# Patient Record
Sex: Female | Born: 1945
Health system: Southern US, Community
[De-identification: ages and names within clinical notes are randomized; demographics above are authoritative.]

## PROBLEM LIST (undated history)

## (undated) DIAGNOSIS — H3530 Unspecified macular degeneration: Secondary | ICD-10-CM

## (undated) DIAGNOSIS — Z9889 Other specified postprocedural states: Secondary | ICD-10-CM

## (undated) DIAGNOSIS — Z9289 Personal history of other medical treatment: Secondary | ICD-10-CM

## (undated) DIAGNOSIS — Z853 Personal history of malignant neoplasm of breast: Secondary | ICD-10-CM

## (undated) HISTORY — PX: OVARY SURGERY: SHX727

## (undated) HISTORY — DX: Unspecified macular degeneration: H35.30

## (undated) HISTORY — DX: Personal history of malignant neoplasm of breast: Z85.3

## (undated) HISTORY — PX: HIP SURGERY: SHX245

## (undated) HISTORY — DX: Other specified postprocedural states: Z98.890

## (undated) HISTORY — DX: Personal history of other medical treatment: Z92.89

## (undated) HISTORY — PX: MASTECTOMY: SHX3

## (undated) HISTORY — PX: APPENDECTOMY: SHX54

---

## 1998-06-23 ENCOUNTER — Other Ambulatory Visit: Admission: RE | Admit: 1998-06-23 | Discharge: 1998-06-23 | Payer: Self-pay | Admitting: *Deleted

## 2000-03-23 ENCOUNTER — Other Ambulatory Visit: Admission: RE | Admit: 2000-03-23 | Discharge: 2000-03-23 | Payer: Self-pay | Admitting: *Deleted

## 2000-06-05 ENCOUNTER — Encounter: Admission: RE | Admit: 2000-06-05 | Discharge: 2000-06-05 | Payer: Self-pay | Admitting: *Deleted

## 2001-05-10 ENCOUNTER — Other Ambulatory Visit: Admission: RE | Admit: 2001-05-10 | Discharge: 2001-05-10 | Payer: Self-pay | Admitting: *Deleted

## 2001-06-18 ENCOUNTER — Ambulatory Visit (HOSPITAL_COMMUNITY): Admission: RE | Admit: 2001-06-18 | Discharge: 2001-06-18 | Payer: Self-pay | Admitting: *Deleted

## 2001-07-13 ENCOUNTER — Ambulatory Visit (HOSPITAL_COMMUNITY): Admission: RE | Admit: 2001-07-13 | Discharge: 2001-07-13 | Payer: Self-pay | Admitting: *Deleted

## 2002-05-02 ENCOUNTER — Encounter: Admission: RE | Admit: 2002-05-02 | Discharge: 2002-05-07 | Payer: Self-pay | Admitting: *Deleted

## 2003-08-08 ENCOUNTER — Other Ambulatory Visit: Admission: RE | Admit: 2003-08-08 | Discharge: 2003-08-08 | Payer: Self-pay | Admitting: *Deleted

## 2010-08-02 ENCOUNTER — Other Ambulatory Visit: Payer: Self-pay | Admitting: Orthopedic Surgery

## 2010-08-02 ENCOUNTER — Encounter (HOSPITAL_COMMUNITY): Payer: BC Managed Care – PPO | Attending: Orthopedic Surgery

## 2010-08-02 DIAGNOSIS — Z01812 Encounter for preprocedural laboratory examination: Secondary | ICD-10-CM | POA: Insufficient documentation

## 2010-08-02 DIAGNOSIS — Z79899 Other long term (current) drug therapy: Secondary | ICD-10-CM | POA: Insufficient documentation

## 2010-08-02 LAB — URINALYSIS, ROUTINE W REFLEX MICROSCOPIC
Bilirubin Urine: NEGATIVE
Glucose, UA: NEGATIVE mg/dL
Hgb urine dipstick: NEGATIVE
Ketones, ur: NEGATIVE mg/dL
Nitrite: NEGATIVE
Protein, ur: NEGATIVE mg/dL
Specific Gravity, Urine: 1.01 (ref 1.005–1.030)
Urobilinogen, UA: 0.2 mg/dL (ref 0.0–1.0)
pH: 6.5 (ref 5.0–8.0)

## 2010-08-02 LAB — CBC
HCT: 41.2 % (ref 36.0–46.0)
Hemoglobin: 13.2 g/dL (ref 12.0–15.0)
MCH: 29.3 pg (ref 26.0–34.0)
MCHC: 32 g/dL (ref 30.0–36.0)
MCV: 91.6 fL (ref 78.0–100.0)
Platelets: 299 10*3/uL (ref 150–400)
RBC: 4.5 MIL/uL (ref 3.87–5.11)
RDW: 13 % (ref 11.5–15.5)
WBC: 7 10*3/uL (ref 4.0–10.5)

## 2010-08-02 LAB — COMPREHENSIVE METABOLIC PANEL
ALT: 15 U/L (ref 0–35)
AST: 19 U/L (ref 0–37)
Albumin: 4.2 g/dL (ref 3.5–5.2)
Alkaline Phosphatase: 60 U/L (ref 39–117)
BUN: 11 mg/dL (ref 6–23)
CO2: 29 mEq/L (ref 19–32)
Calcium: 9.8 mg/dL (ref 8.4–10.5)
Chloride: 104 mEq/L (ref 96–112)
Creatinine, Ser: 0.68 mg/dL (ref 0.4–1.2)
GFR calc Af Amer: >60 mL/min (ref >60)
GFR calc non Af Amer: >60 mL/min (ref >60)
Glucose, Bld: 92 mg/dL (ref 70–99)
Potassium: 4.4 mEq/L (ref 3.5–5.1)
Sodium: 140 mEq/L (ref 135–145)
Total Bilirubin: 0.7 mg/dL (ref 0.3–1.2)
Total Protein: 7.5 g/dL (ref 6.0–8.3)

## 2010-08-02 LAB — DIFFERENTIAL
Basophils Absolute: 0.1 10*3/uL (ref 0.0–0.1)
Basophils Relative: 1 % (ref 0–1)
Eosinophils Absolute: 0.2 10*3/uL (ref 0.0–0.7)
Eosinophils Relative: 3 % (ref 0–5)
Lymphocytes Relative: 30 % (ref 12–46)
Lymphs Abs: 2.1 10*3/uL (ref 0.7–4.0)
Monocytes Absolute: 0.4 10*3/uL (ref 0.1–1.0)
Monocytes Relative: 6 % (ref 3–12)
Neutro Abs: 4.2 10*3/uL (ref 1.7–7.7)
Neutrophils Relative %: 60 % (ref 43–77)

## 2010-08-02 LAB — SURGICAL PCR SCREEN
MRSA, PCR: NEGATIVE
Staphylococcus aureus: NEGATIVE

## 2010-08-02 LAB — APTT: aPTT: 37 seconds (ref 24–37)

## 2010-08-02 LAB — PROTIME-INR
INR: 1.02 (ref 0.00–1.49)
Prothrombin Time: 13.6 seconds (ref 11.6–15.2)

## 2010-08-05 NOTE — H&P (Signed)
  NAME:  [name]               ACCOUNT NO.:  [id number]  MEDICAL RECORD NO.:  [id number]          PATIENT TYPE:  LOCATION:                                 FACILITY:  PHYSICIAN:  Madlyn Frankel. Charlann Boxer, M.D.  DATE OF BIRTH:  [birth year]  DATE OF ADMISSION: DATE OF DISCHARGE:                             HISTORY & PHYSICAL   PHYSICIAN ASSISTANT:  Jodene Nam.  DATE OF SURGERY:  August 10, 2010.  CHIEF COMPLAINT:  Right hip osteoarthritis.  HISTORY OF PRESENT ILLNESS:  This is a 65 year old female for evaluation of her bilateral hips with osteoarthritis of bilateral hips, right greater than left.  She has had conservative treatment without relief of her symptoms.  After discussion of treatments, benefits, risks and options, the patient is now scheduled for total hip arthroplasty on the right.  She has had breast cancer in the past, and therefore will not get tranexamic acid preoperatively.  PAST MEDICAL HISTORY:  Drug allergies to DURICEF with a rash.  CURRENT MEDICATIONS:  Boniva.  PAST SURGERIES:  Tonsils and appendix in childhood, left mastectomy for breast cancer with breast reimplantation and ovarian cyst removal in 2000.  SERIOUS MEDICAL ILLNESSES:  Osteoporosis.  FAMILY HISTORY:  Father deceased with MI.  Mother healthy at 33.  One sibling healthy at 66.  Two healthy children.  SOCIAL HISTORY:  The patient is married.  She is a Ph.D. Veterinary surgeon.  She does not smoke and has about one drink a day.  REVIEW OF SYSTEMS:  CENTRAL NERVOUS SYSTEM:  Negative for headache, blurred vision, or dizziness. PULMONARY:  Negative for shortness of breath, PND or orthopnea.  CARDIOVASCULAR:  Negative for chest pain or palpitation.  GI:  Negative for ulcers, hepatitis.  GU:  Negative for urinary tract difficulty.  MUSCULOSKELETAL:  Positive as in HPI. HEMATOLOGIC/ONCOLOGIC:  History of breast cancer.  PHYSICAL EXAM:  VITAL SIGNS:  BP 150/84, respirations 16, pulse 64  and regular. GENERAL APPEARANCE:  This is a well-developed, well-nourished lady in no acute distress. HEENT:  Head normocephalic.  Nose patent.  Ears patent.  Pupils equal, round, and reactive to light.  Throat without injection.  Neck:  Supple without adenopathy.  Carotids are 2+ without bruit. CHEST:  Clear to auscultation.  No rales or rhonchi.  Respirations 16. HEART:  Regular rate and rhythm at 64 beats per minute without murmur. ABDOMEN:  Soft with active bowel sounds.  No masses or organomegaly. NEUROLOGIC:  The patient is alert and oriented to time, place and person. Cranial nerves II through XII grossly intact. EXTREMITIES:  Both hips with decreased range of motion, significant pain during the range of motion of both.  ASSESSMENT:  Right hip osteoarthritis.  PLAN OF ACTION:  Total hip arthroplasty, right hip.     Jaquelyn Bitter. Chabon, P.A.   ______________________________ Madlyn Frankel Charlann Boxer, M.D.    SJC/MEDQ  D:  07/28/2010  T:  07/29/2010  Job:  [id number]  Electronically Signed by Jodene Nam P.A. on 08/01/2010 08:10:02 AM Electronically Signed by Durene Romans M.D. on 08/05/2010 12:12:07 PM

## 2010-08-10 ENCOUNTER — Inpatient Hospital Stay (HOSPITAL_COMMUNITY): Payer: BC Managed Care – PPO

## 2010-08-10 ENCOUNTER — Inpatient Hospital Stay (HOSPITAL_COMMUNITY)
Admission: RE | Admit: 2010-08-10 | Discharge: 2010-08-12 | DRG: 818 | Disposition: A | Discharge status: Home Health | Payer: BC Managed Care – PPO | Source: Ambulatory Visit | Attending: Orthopedic Surgery | Admitting: Orthopedic Surgery

## 2010-08-10 DIAGNOSIS — D62 Acute posthemorrhagic anemia: Secondary | ICD-10-CM | POA: Diagnosis not present

## 2010-08-10 DIAGNOSIS — M810 Age-related osteoporosis without current pathological fracture: Secondary | ICD-10-CM | POA: Diagnosis present

## 2010-08-10 DIAGNOSIS — M1610 Unilateral primary osteoarthritis, unspecified hip: Principal | ICD-10-CM | POA: Diagnosis present

## 2010-08-10 DIAGNOSIS — M169 Osteoarthritis of hip, unspecified: Principal | ICD-10-CM | POA: Diagnosis present

## 2010-08-10 DIAGNOSIS — Z853 Personal history of malignant neoplasm of breast: Secondary | ICD-10-CM

## 2010-08-10 LAB — TYPE AND SCREEN: Antibody Screen: NEGATIVE

## 2010-08-10 LAB — GLUCOSE, CAPILLARY: Glucose-Capillary: 176 mg/dL — ABNORMAL HIGH (ref 70–99)

## 2010-08-10 LAB — ABO/RH: ABO/RH(D): A NEG

## 2010-08-11 LAB — BASIC METABOLIC PANEL
BUN: 6 mg/dL (ref 6–23)
Chloride: 104 mEq/L (ref 96–112)
GFR calc Af Amer: >60 mL/min (ref >60)
GFR calc non Af Amer: >60 mL/min (ref >60)
Potassium: 4.3 mEq/L (ref 3.5–5.1)
Sodium: 133 mEq/L — ABNORMAL LOW (ref 135–145)

## 2010-08-11 LAB — CBC
Platelets: 208 10*3/uL (ref 150–400)
RBC: 2.55 MIL/uL — ABNORMAL LOW (ref 3.87–5.11)
RDW: 12.8 % (ref 11.5–15.5)
WBC: 9.9 10*3/uL (ref 4.0–10.5)

## 2010-08-11 NOTE — Op Note (Signed)
NAMEJOANELL, [name]              ACCOUNT NO.:  [id number]  MEDICAL RECORD NO.:  [id number]           PATIENT TYPE:  I  LOCATION:  1603                         FACILITY:  [facility]  PHYSICIAN:  Madlyn Frankel. Charlann Boxer, M.D.  DATE OF BIRTH:  March 22, 1946  DATE OF PROCEDURE:  08/10/2010 DATE OF DISCHARGE:                              OPERATIVE REPORT   PREOPERATIVE DIAGNOSIS:  Right hip osteoarthritis.  POSTOPERATIVE DIAGNOSIS:  Right hip osteoarthritis.  PROCEDURE:  Right total hip replacement through an anterior approach.  COMPONENTS USED:  DePuy hip system size and 50 Pinnacle shell single cancellous screw hole eliminator and 32+4 AltrX liner and size 6 standard Tri-Lock stem with a 32 +1 Delta ceramic ball.  SURGEON:  Madlyn Frankel. Charlann Boxer, M.D.  ASSISTANT:  Jaquelyn Bitter. Chabon, P.A.  ANESTHESIA:  General.  SPECIMENS:  None.  COMPLICATIONS:  None.  BLOOD LOSS:  About 900 mL.  DRAINS:  One Hemovac.  INDICATIONS FOR PROCEDURE:  Ms. Peitz is a 65 year old female who presented to office for bilateral hip pain.  Radiographs revealed end- stage bilateral hip pain, right was clinically little bit worse than the left.  She at this point was ready to proceed with more definitive measures of hip arthroplasty.  Risks and benefits of infection, DVT, component failure, need for revision surgery all discussed as well as the surgical approaches.  She wished at this point to proceed with the anterior hip replacement.  Specific risks and benefits were discussed regarding this compared to posterior approach.  Consent was obtained for benefit of pain relief.  PROCEDURE IN DETAIL:  The patient was brought to operative theater. Once adequate anesthesia, preoperative antibiotics, clindamycin administered, she was positioned supine on the [employer] table.  Once bony prominences were adequately padded, fluoroscopy was brought into the field to identify landmarks and to confirm positioning.  The right  hip was then prepped from the proximal iliac crest to mid to lower thigh.  Once it was prepped and draped, I marked the incision and then dressed it with a shower curtain drape.  A time-out was performed identifying the patient, planned procedure and the extremity.  At this point, a incision was made 2 cm lateral to the anterior-superior iliac spine extending over the anterior aspect of the trochanter.  Sharp dissection was carried to the fascia.  The tensor fascial lata and soft tissue planes created, protractor was placed.  The fascia was then incised and the muscle belly separated from the undersurface of the fascia and retracted and swept laterally.  A retractor was placed in the superior neck.  At this point, the anterior circumflex vessels were cauterized and pericapsular fat removed and inferior retractor placed. A bent Hohmann retractor was placed over the anterior aspect of the acetabulum and the rectus elevated off the acetabulum.  At this point, L capsulotomy made along the superior neck to the trochanteric fossa and then extending to the lesser trochanter.  Stay sutures were placed and the retractors were then placed intracapsular.  At this point, identified landmarks and marked the proposed neck cut. Under fluoroscopic imaging, I then confirmed this neck cut.  A neck osteotomy was made with traction applied.  The femoral head was removed without difficulty and noted severe degenerative changes of right hip.  Traction was let loose and retractors were placed for preparation of acetabulum.  Labrum was debrided sharply.  The patient had significant medial wall osteophytes.  I began with reaming with a 44 reamer and reamed down to the medial wall and then into the correct orientation of the cup.  Fluoroscopy was used to confirm the position of reaming.  I reamed up to 49 reamer with good bony bed preparation.  A 50 Pinnacle cup was chosen.  This cup was then impacted with a  curved impactor under fluoroscopy and I readily confirmed the location in terms of abduction. Adequate forward flexion was identified anatomically with cup position, posterior and superior lateral exposed as well as beneath the anterior rim anteriorly.  A single cancellus screw was placed and hole limiter placed.  A final 32+4 AltrX liner was then impacted with good visualized rim fit.  At this point, the attention was now directed to the femur.  The femur was rotated to about 80 degrees, initially removing some of the inferior capsule.  A retractor was placed along the medial aspect of the femoral neck.  The capsule was released posteriorly and a posterior retractor placed.  Identified the anatomy of the proximal femur, used a box osteotome to open up the proximal femur and then by hand passed a broach.  The starting broach then evaluated radiographically to confirm that the stem was in the correct position.  Once this was done, retractors were replaced using the hook for elevation of the femur.  I then began broaching with a size 1 broach and broached up to a size 5 initially.  I did do a trial reduction in this form with a 5 broach in place, a standard neck based off for radiographs preoperatively.  A 32 +1 ball was trialed.  During the trial reduction, confirmed that the leg lengths appeared to be near equal.  At this point, given the advanced degenerative change in the left hip, it was not as much of a concern; however, it was very close.  I also felt that I could jump up to a size 6 broach with little bit better fit.  At this point, the hip was dislocated.  The size 6 broach was then impacted followed by the opening up of the 6 standard Tri-Lock stem.  This was then impacted and sat about 1-2 mm prior to the neck cut medially.  I did a repeat trial reduction at this point with a 32 +1 ball and was happy with the range of motion, the stability as well as the leg lengths on  radiographs.  The final 32 +1 ball was chosen and impacted on a clean and dry trunnion and the hip reduced.  The hip had been irrigated throughout the case and again at this point.  I reapproximated the anterior capsule to the superior leaflet using #1 Vicryl.  The remaining of the wound at this point was closed over medium Hemovac drain using #1 Vicryl on the fascia, the tensor fascia lata, 2-0 Vicryl on the subcu layer and running 4-0 Monocryl.  The hip was cleaned, dried, dressed sterilely with Dermabond and Aquacel dressing and drain site was dressed separately.  She was then extubated and brought to the recovery room in stable condition, tolerating the procedure well.     Madlyn Frankel Charlann Boxer, M.D.  MDO/MEDQ  D:  08/10/2010  T:  08/10/2010  Job:  [id number]  Electronically Signed by Durene Romans M.D. on 08/11/2010 81:19:14 PM

## 2010-08-12 LAB — BASIC METABOLIC PANEL
Chloride: 107 mEq/L (ref 96–112)
GFR calc non Af Amer: >60 mL/min (ref >60)
Glucose, Bld: 110 mg/dL — ABNORMAL HIGH (ref 70–99)
Potassium: 4.2 mEq/L (ref 3.5–5.1)
Sodium: 138 mEq/L (ref 135–145)

## 2010-08-12 LAB — CBC
HCT: 22.3 % — ABNORMAL LOW (ref 36.0–46.0)
Hemoglobin: 7.3 g/dL — ABNORMAL LOW (ref 12.0–15.0)
MCV: 91.8 fL (ref 78.0–100.0)
RBC: 2.43 MIL/uL — ABNORMAL LOW (ref 3.87–5.11)
RDW: 13 % (ref 11.5–15.5)
WBC: 7.7 10*3/uL (ref 4.0–10.5)

## 2010-08-16 NOTE — Discharge Summary (Signed)
[name], [name]              ACCOUNT NO.:  [id number]  MEDICAL RECORD NO.:  [id number]           PATIENT TYPE:  I  LOCATION:  1603                         FACILITY:  [facility]  PHYSICIAN:  Madlyn Frankel. Charlann Boxer, M.D.  DATE OF BIRTH:  [birth year]  DATE OF ADMISSION:  08/10/2010 DATE OF DISCHARGE:  08/12/2010                              DISCHARGE SUMMARY   ADMITTING DIAGNOSIS:  Right hip osteoarthritis.  DISCHARGE DIAGNOSES: 1. Right hip osteoarthritis, status post right total hip replacement     on 08/10/2010. 2. Osteoporosis.  BRIEF HISTORY:  [name] is a 65 year old female with right hip osteoarthritis.  She was seen and evaluated in the office and was noted to have failed all conservative measures prior to evaluation.  She has significant reduction in overall quality of life and wished to discuss more advanced measures of treatment.  Risks and benefits of hip replacement were discussed and reviewed.  Consent was obtained for this for same-day surgery.  HOSPITAL COURSE:  The patient admitted for same-day surgery on 08/10/2010.  She underwent a right total hip replacement through an anterior approach.  Please see dictated operative note for full details of the procedure.  Postoperatively, after routine stay in the recovery room, she was transferred to the orthopedic ward where she remained for her 2 postoperative days.  Postoperative day #1, she had her Foley removed.  Hemovac removed after 24 hours for monitoring due to her receiving tranexamic acid.  She was seen and evaluated by Physical Therapy, weightbearing as tolerated in the right lower extremity with no precautions.  Her labs on postoperative day #2 revealed hematocrit of 22.3 down only from 23.4, thus stable.  Her vital signs remained stable most importantly with no significant hypertensive episodes while in the hospital.  Her electrolytes remained stable with no bump in her renal function.  Her wound remained  dry and on postoperative day #2, she is ready to go home.  DISCHARGE INSTRUCTIONS:  The patient to be discharged home with her husband with home health physical therapy.  She will keep her right hip wound dry and clean.  She has an Aquacel dressing and will remain on for 7-8 days, it is waterproof, so she may shower without covering; otherwise, she should keep her wound as dry as possible.  She will return to see Dr. Durene Romans at [facility] at [phone] in 2-week period of time.  Any questions can be addressed to our office.  DISCHARGE MEDICATIONS: 1. Her    home use of Boniva as directed.  Otherwise, she will be on     Colace 100 mg p.o. b.i.d. for constipation while on pain medicine. 2. MiraLax 17 g p.o. daily as needed for constipation while on pain     medicine. 3. Robaxin 500 mg q.6 hours as needed for muscle spasms and pain. 4. Norco 7.5/325 mg 1-2 tablets every 4-6 hours as needed for pain     while doing therapy in Recovery. 5. Xarelto 10 mg p.o. daily for blood thinning for 10 days and then do aspirin for 4 weeks 325 mg p.o. daily. 6. Iron 325  mg p.o. 2-3 times a day as tolerable for up to 3 weeks.     This will be directed through our office as well.  Questions were encouraged and answers reviewed at the time of discharge.     Madlyn Frankel Charlann Boxer, M.D.     MDO/MEDQ  D:  08/15/2010  T:  08/16/2010  Job:  [id number]  Electronically Signed by Durene Romans M.D. on 08/16/2010 10:32:03 AM

## 2010-08-31 ENCOUNTER — Other Ambulatory Visit: Payer: Self-pay | Admitting: Orthopedic Surgery

## 2010-08-31 ENCOUNTER — Encounter (HOSPITAL_COMMUNITY): Payer: BC Managed Care – PPO

## 2010-08-31 LAB — PROTIME-INR
INR: 1.05 (ref 0.00–1.49)
Prothrombin Time: 13.9 s (ref 11.6–15.2)

## 2010-08-31 LAB — DIFFERENTIAL
Basophils Absolute: 0 K/uL (ref 0.0–0.1)
Basophils Relative: 0 % (ref 0–1)
Eosinophils Absolute: 0.2 K/uL (ref 0.0–0.7)
Eosinophils Relative: 2 % (ref 0–5)
Lymphocytes Relative: 19 % (ref 12–46)
Lymphs Abs: 1.4 K/uL (ref 0.7–4.0)
Monocytes Absolute: 0.6 K/uL (ref 0.1–1.0)
Monocytes Relative: 9 % (ref 3–12)
Neutro Abs: 5.1 K/uL (ref 1.7–7.7)
Neutrophils Relative %: 70 % (ref 43–77)

## 2010-08-31 LAB — CBC
HCT: 35.1 % — ABNORMAL LOW (ref 36.0–46.0)
Hemoglobin: 10.9 g/dL — ABNORMAL LOW (ref 12.0–15.0)
MCH: 29.9 pg (ref 26.0–34.0)
MCHC: 31.1 g/dL (ref 30.0–36.0)
MCV: 96.4 fL (ref 78.0–100.0)
Platelets: 385 K/uL (ref 150–400)
RBC: 3.64 MIL/uL — ABNORMAL LOW (ref 3.87–5.11)
RDW: 14.4 % (ref 11.5–15.5)
WBC: 7.4 K/uL (ref 4.0–10.5)

## 2010-08-31 LAB — SURGICAL PCR SCREEN
MRSA, PCR: NEGATIVE
Staphylococcus aureus: NEGATIVE

## 2010-08-31 LAB — BASIC METABOLIC PANEL WITH GFR
BUN: 13 mg/dL (ref 6–23)
CO2: 27 meq/L (ref 19–32)
Calcium: 10 mg/dL (ref 8.4–10.5)
Chloride: 105 meq/L (ref 96–112)
Creatinine, Ser: 0.66 mg/dL (ref 0.4–1.2)
GFR calc non Af Amer: >60 mL/min
Glucose, Bld: 88 mg/dL (ref 70–99)
Potassium: 4.7 meq/L (ref 3.5–5.1)
Sodium: 140 meq/L (ref 135–145)

## 2010-08-31 LAB — URINALYSIS, ROUTINE W REFLEX MICROSCOPIC
Bilirubin Urine: NEGATIVE
Glucose, UA: NEGATIVE mg/dL
Hgb urine dipstick: NEGATIVE
Ketones, ur: NEGATIVE mg/dL
Nitrite: NEGATIVE
Protein, ur: NEGATIVE mg/dL
Specific Gravity, Urine: 1.012 (ref 1.005–1.030)
Urobilinogen, UA: 0.2 mg/dL (ref 0.0–1.0)
pH: 5 (ref 5.0–8.0)

## 2010-08-31 LAB — APTT: aPTT: 33 s (ref 24–37)

## 2010-09-07 ENCOUNTER — Inpatient Hospital Stay (HOSPITAL_COMMUNITY): Payer: BC Managed Care – PPO

## 2010-09-07 ENCOUNTER — Inpatient Hospital Stay (HOSPITAL_COMMUNITY)
Admission: RE | Admit: 2010-09-07 | Discharge: 2010-09-09 | DRG: 818 | Disposition: A | Discharge status: Home Health | Payer: BC Managed Care – PPO | Source: Ambulatory Visit | Attending: Orthopedic Surgery | Admitting: Orthopedic Surgery

## 2010-09-07 DIAGNOSIS — M169 Osteoarthritis of hip, unspecified: Principal | ICD-10-CM | POA: Diagnosis present

## 2010-09-07 DIAGNOSIS — Z01812 Encounter for preprocedural laboratory examination: Secondary | ICD-10-CM

## 2010-09-07 DIAGNOSIS — M1610 Unilateral primary osteoarthritis, unspecified hip: Principal | ICD-10-CM | POA: Diagnosis present

## 2010-09-07 DIAGNOSIS — D649 Anemia, unspecified: Secondary | ICD-10-CM | POA: Diagnosis present

## 2010-09-07 LAB — TYPE AND SCREEN
ABO/RH(D): A NEG
Antibody Screen: NEGATIVE

## 2010-09-08 LAB — CBC
HCT: 23.7 % — ABNORMAL LOW (ref 36.0–46.0)
MCHC: 31.6 g/dL (ref 30.0–36.0)
MCV: 94 fL (ref 78.0–100.0)
RDW: 13.9 % (ref 11.5–15.5)
WBC: 7.7 10*3/uL (ref 4.0–10.5)

## 2010-09-08 LAB — BASIC METABOLIC PANEL
BUN: 5 mg/dL — ABNORMAL LOW (ref 6–23)
GFR calc non Af Amer: >60 mL/min (ref >60)
Glucose, Bld: 106 mg/dL — ABNORMAL HIGH (ref 70–99)
Potassium: 4 mEq/L (ref 3.5–5.1)

## 2010-09-09 LAB — BASIC METABOLIC PANEL
BUN: 2 mg/dL — ABNORMAL LOW (ref 6–23)
Calcium: 8.2 mg/dL — ABNORMAL LOW (ref 8.4–10.5)
Creatinine, Ser: 0.54 mg/dL (ref 0.4–1.2)
GFR calc non Af Amer: >60 mL/min (ref >60)
Glucose, Bld: 102 mg/dL — ABNORMAL HIGH (ref 70–99)
Sodium: 140 mEq/L (ref 135–145)

## 2010-09-09 LAB — CBC
HCT: 24.3 % — ABNORMAL LOW (ref 36.0–46.0)
MCHC: 32.5 g/dL (ref 30.0–36.0)
RDW: 13.8 % (ref 11.5–15.5)

## 2010-09-10 NOTE — Op Note (Signed)
[name], [name]              ACCOUNT NO.:  [id number]  MEDICAL RECORD NO.:  [id number]           PATIENT TYPE:  I  LOCATION:  0008                         FACILITY:  [facility]  PHYSICIAN:  Madlyn Frankel. Charlann Boxer, M.D.  DATE OF BIRTH:  1945/12/13  DATE OF PROCEDURE:  09/07/2010 DATE OF DISCHARGE:                              OPERATIVE REPORT   PREOPERATIVE DIAGNOSIS:  Left hip osteoarthritis.  POSTOPERATIVE DIAGNOSIS:  Left hip osteoarthritis.  PROCEDURE:  Left total-hip replacement through an anterior approach utilizing DePuy component size 50 pinnacle shell, size 32 +4 neutral Altrex liner, size 5 standard Trilock stem with a 32 +1 Delta ceramic ball.  SURGEON:  Madlyn Frankel. Charlann Boxer, M.D.  ASSISTANT:  Jaquelyn Bitter. Chabon, P.A.C.  ANESTHESIA:  General.  SPECIMENS:  None.  COMPLICATIONS:  None.  ESTIMATED BLOOD LOSS:  About 500 cc.  DRAINS:  One Hemovac.  INDICATIONS FOR PROCEDURE:  [name] is a pleasant 65 year old patientthat had presented to the office for evaluation of bilateral hip pain. She is now status post a right total-hip replacement  that she has done very well with.  She is, at this point, ready to have her left hip replaced as previously planned and reviewed in the office.  The risks and benefits were discussed and reviewed as well as the postoperative course and expectations.  She had done very well with the right hip and was ready to have this done.  Consent was obtained.  PROCEDURE IN DETAIL:  The patient was brought to the operative theater. Once adequate anesthesia, preoperative antibiotics, clindamycin administered, she was positioned supine on the [employer] table.  Bony prominences were padded and adequate positioning.  At this point, fluoroscopy was brought into the field identifying landmarks and parameters for radiographic evaluation of the hip during the case.  At this point, the left hip was prepped and draped from the proximal to the iliac crest to  the mid femur or thigh.  I then made markings identifying landmarks and the location for incision and then used the shower curtain for final draping.  Time-out was performed identifying the patient, procedure and extremity.  An incision was made 2 cm distal and lateral to the anterior-superior iliac spine, extending over the anterior aspect of the trochanter.  The tensor fascia muscle fascia was identified and soft-tissue planes were created placing the protractor.  The fascia was then incised, the muscle separated from underneath the underside of the fascia.  Superior neck retractor was placed.  At this point, I cauterized the anterior circumflex vessels and removed pericapsular fat and placed a retractor inferiorly.  At this point, once I elevated the rectus femoris from the anterior aspect of the acetabulum, an L capsulotomy was made.  Stay sutures were placed and the retractor was placed intracapsular.  At this point, traction was applied and then fluoroscopy was brought in to identify the location and the level of my neck cut as compared to the contralateral hip.  Following the neck cut, the femoral head was removed without difficulty or apparent complications at this point.  At this point, based on the contralateral hip and  using a 50 cup, I used a 47 reamer initially reaming just proximal to the foveal region.  I then reamed under fluoroscopic imaging, confirming the depth of reaming as well as orientation.  I reamed up to a 49 reamer with good bony bed preparation and following final debridement and preparation, the final 50 cup was chosen.  The final 50 Pinnacle cup was then impacted with the curved impactor under fluoroscopic imaging.  A single cancellus screw was placed, the hole eliminator was placed and the final 32 +4 Altrex liner was impacted into position.  At this point, the femur was rotated to 80 degrees as I released the inferior capsule off the inferior portion  of the medial neck.  The lateral hook was then applied and the femur elevated under my direct pressure with the hook held in place at this point.  I further rotated the hip to about 100 degrees and released the posterior capsule to allow for placement of the retractor against the lateral posterior trochanter. The leg was now dropped, extended and adducted.  With the proximal and posterior lateral aspect of the femur exposed, the box osteotome was used to open up the proximal femur.  I then used the pituitary rongeur to remove some remaining bone laterally.  The starting reamer was then used and passed by hand into the femur.  I then began broaching and broached up initially to a size 6, which did sit a little bit proud of the current neck cut based on the contralateral hip.  I did a trial reduction at this point, and when I did this, we confirmed radiographically that perhaps a little bit longer on this side.  For this reason, I chose to drop down to a size 5 broach and removed some of the bone off the proximal neck.  I redid my trial reduction and felt at this point that the leg lengths were very close to equal.  The final 5 standard Trilock stem was chosen.  The trial components were removed.  The final size 5 Trilock standard stem was then impacted and sat just a millimeter or so above my neck cut and based on the trial reductions, we chose a 32 +1 ball.  This final ball was then impacted onto a clean and dry trunnion and the hip reduced.  We had irrigated the hip throughout the case and again at this point.  I was able to reapproximate the anterior capsule to itself using #1 Vicryl and I placed a medium Hemovac drain deep.  I had taken out posterior and distal to the incision.  The fascia of the tensor fascia lata was then reapproximated using #1 Vicryl in a running fashion.  The remaining of the wound was closed with 2-0 Vicryl and running 4-0 Monocryl.  The hip was cleaned, dried  and dressed sterilely using Dermabond and Aquacel dressing.  She was then brought to the recovery room in stable condition, tolerating the procedure well.     Madlyn Frankel Charlann Boxer, M.D.     MDO/MEDQ  D:  09/07/2010  T:  09/07/2010  Job:  [id number]  Electronically Signed by Durene Romans M.D. on 09/10/2010 03:40:37 PM

## 2010-10-04 NOTE — Discharge Summary (Signed)
NAMEALESE, FURNISS              ACCOUNT NO.:  [id number]  MEDICAL RECORD NO.:  [id number]           PATIENT TYPE:  I  LOCATION:  1619                         FACILITY:  [facility]  PHYSICIAN:  Madlyn Frankel. Charlann Boxer, M.D.  DATE OF BIRTH:  03-19-1946  DATE OF ADMISSION:  09/07/2010 DATE OF DISCHARGE:  09/09/2010                              DISCHARGE SUMMARY   ADMITTING DIAGNOSIS:  Left hip osteoarthritis.  DISCHARGE DIAGNOSES: 1. Left hip osteoarthritis status post left total hip replacement on     September 07, 2010. 2. History of right total hip replacement earlier this year in March. 3. Osteoporosis.  BRIEF HISTORY:  Ms. [name] is a 65 year old female with history of bilateral hip osteoarthritis.  She had a right total hip replacement, which was performed on August 10, 2010, with a planned staged left hip replacement.  She had this done through an anterior approach.  She has done very well with her right hip and is ready to have this left one done.  Risks and benefits and hospital course reviewed and discussed. Consent was obtained.  HOSPITAL COURSE:  The patient admitted for same-day surgery for a left total hip replacement.  It was performed on September 07, 2010.  Please see dictated operative note for full details of the procedure. Postoperatively, after routine stay in the recovery room, she was transferred to orthopedic ward where she remained for her 2-day hospital stay.  Her postoperative course was uncomplicated.  Her postoperative hematocrit was 24.3, but she remained stable from a vital standpoint. That was the hematocrit on the postop day #2.  She did not receive any transfusions, just observation and iron.  Her electrolytes remained stable.  She was seen and evaluated by Physical Therapy and was up weightbearing as tolerated.  She had her Foley catheter and Hemovac drains removed on day #1.  She was placed on a regular diet. Medications were altered to prevent any excessive  bowel activity including not utilizing stool softeners.  By postop day #2, she was ready to go home.  DISCHARGE INSTRUCTIONS:  The patient will be discharged to home with home health physical therapy that she had with her right hip.  She will be weightbearing as tolerated.  She does have an Aquacel  dressing, which can remain in place for 7 to 8 days and then be removed and gauze and tape applied.  She will return to see Dr. Durene Romans at [facility] at [phone].  DISCHARGE MEDICATIONS:  Her discharge medications include iron 325 mg 2 to 3 times a day as needed for 2 to 3 weeks for her postoperative anemia, Robaxin 500 mg p.o. q.6 h. as needed for muscle spasm and pain, tramadol 50 mg 1 to 2 tablets every 6 hours as needed for pain, acidophilus 2 capsules q.a.m., calcium with vitamin D b.i.d., digestive enzyme over the counter as needed, and fish oil.  She will also use Xarelto, this in the postoperative period, then switch to aspirin after 10 days.  Any orthopedic questions can be addressed to our office.  Madlyn Frankel Charlann Boxer, M.D.     MDO/MEDQ  D:  09/27/2010  T:  09/27/2010  Job:  [id number]  Electronically Signed by Durene Romans M.D. on 10/04/2010 03:40:22 PM

## 2011-11-16 IMAGING — CR DG PORTABLE PELVIS
1 series · 1 of 1 positions shown · non-contrast
Comparison: None.

CLINICAL DATA: Total hip arthroplasty

PORTABLE PELVIS

[AP]
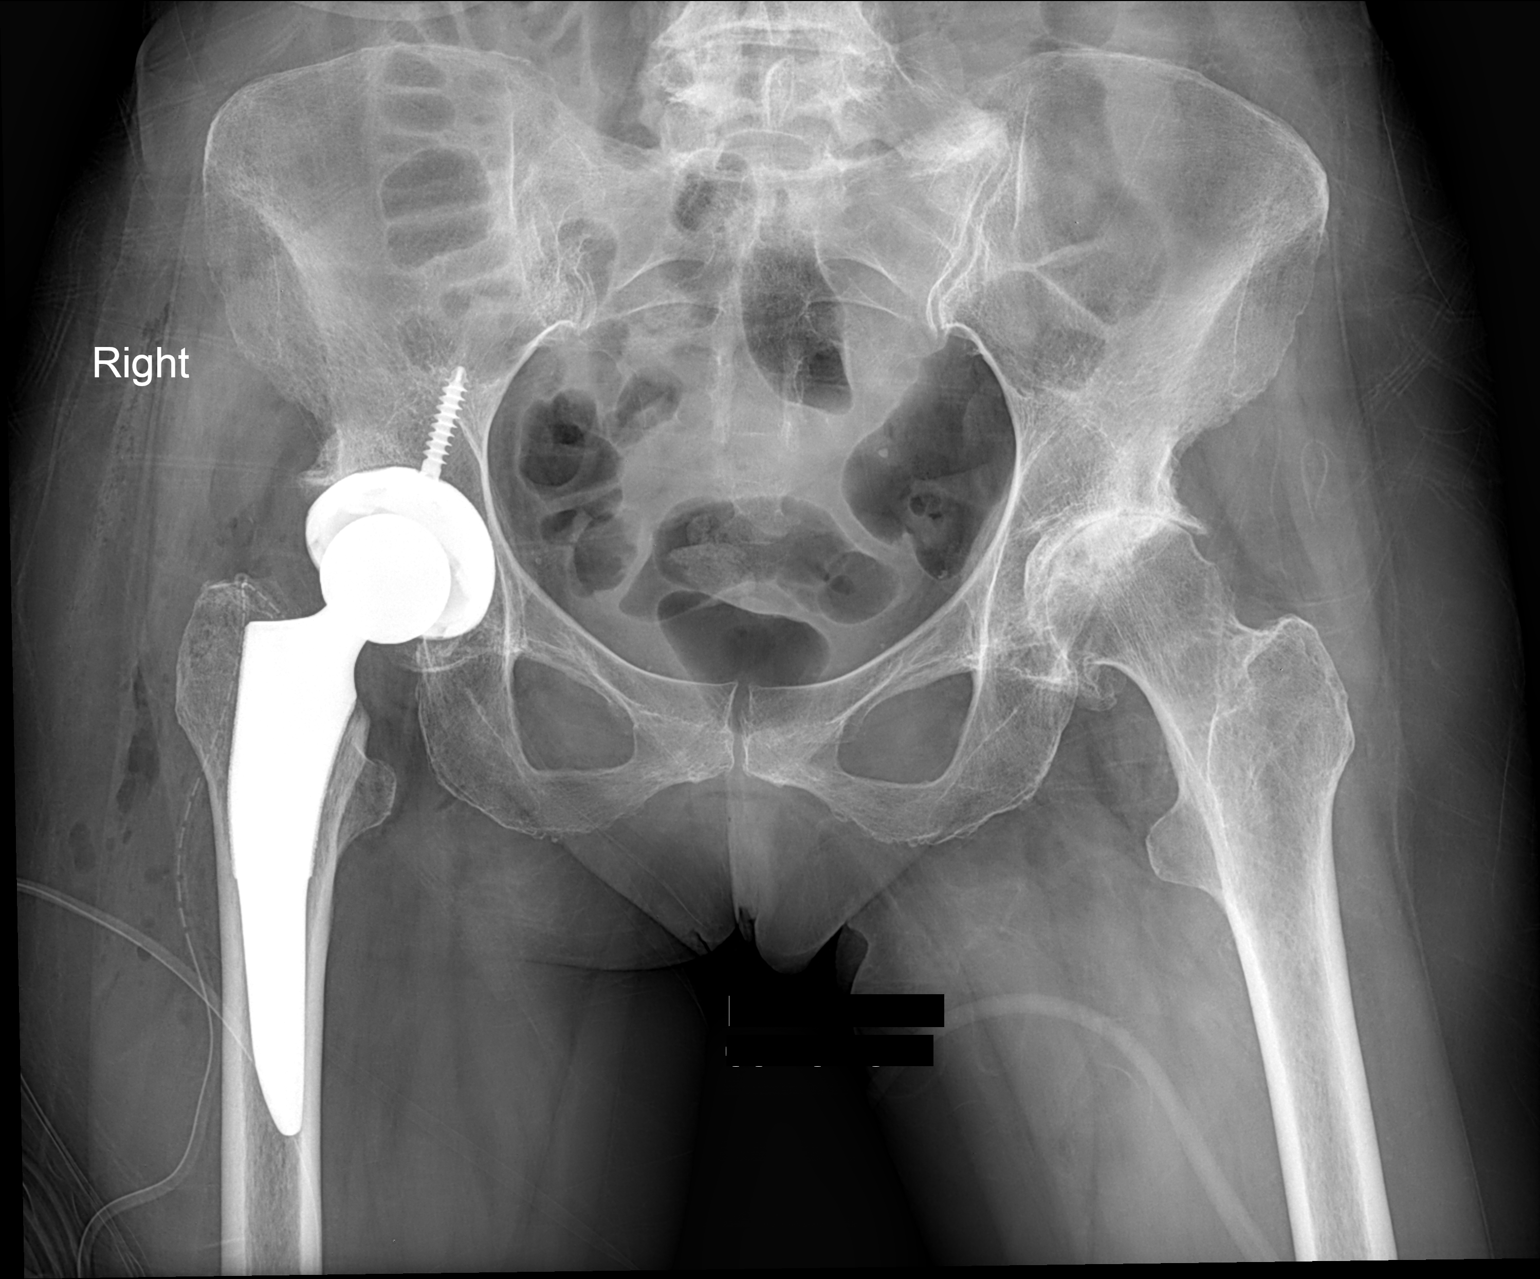

[1 of 1 positions shown; findings below may reference images not displayed]

FINDINGS: Right total hip arthroplasty is in place.  Anatomic
alignment of the osseous and prosthetic structures.  There is no
breakage or loosening of the hardware. Severe osteoarthritic change
of the left hip joint is superimposed.
IMPRESSION: Right total hip arthroplasty anatomically aligned.

## 2011-12-14 IMAGING — CR DG PORTABLE PELVIS
1 series · 1 of 1 positions shown · non-contrast
Comparison: 08/10/2010

CLINICAL DATA: Postop left hip arthroplasty.

PORTABLE PELVIS

[AP]
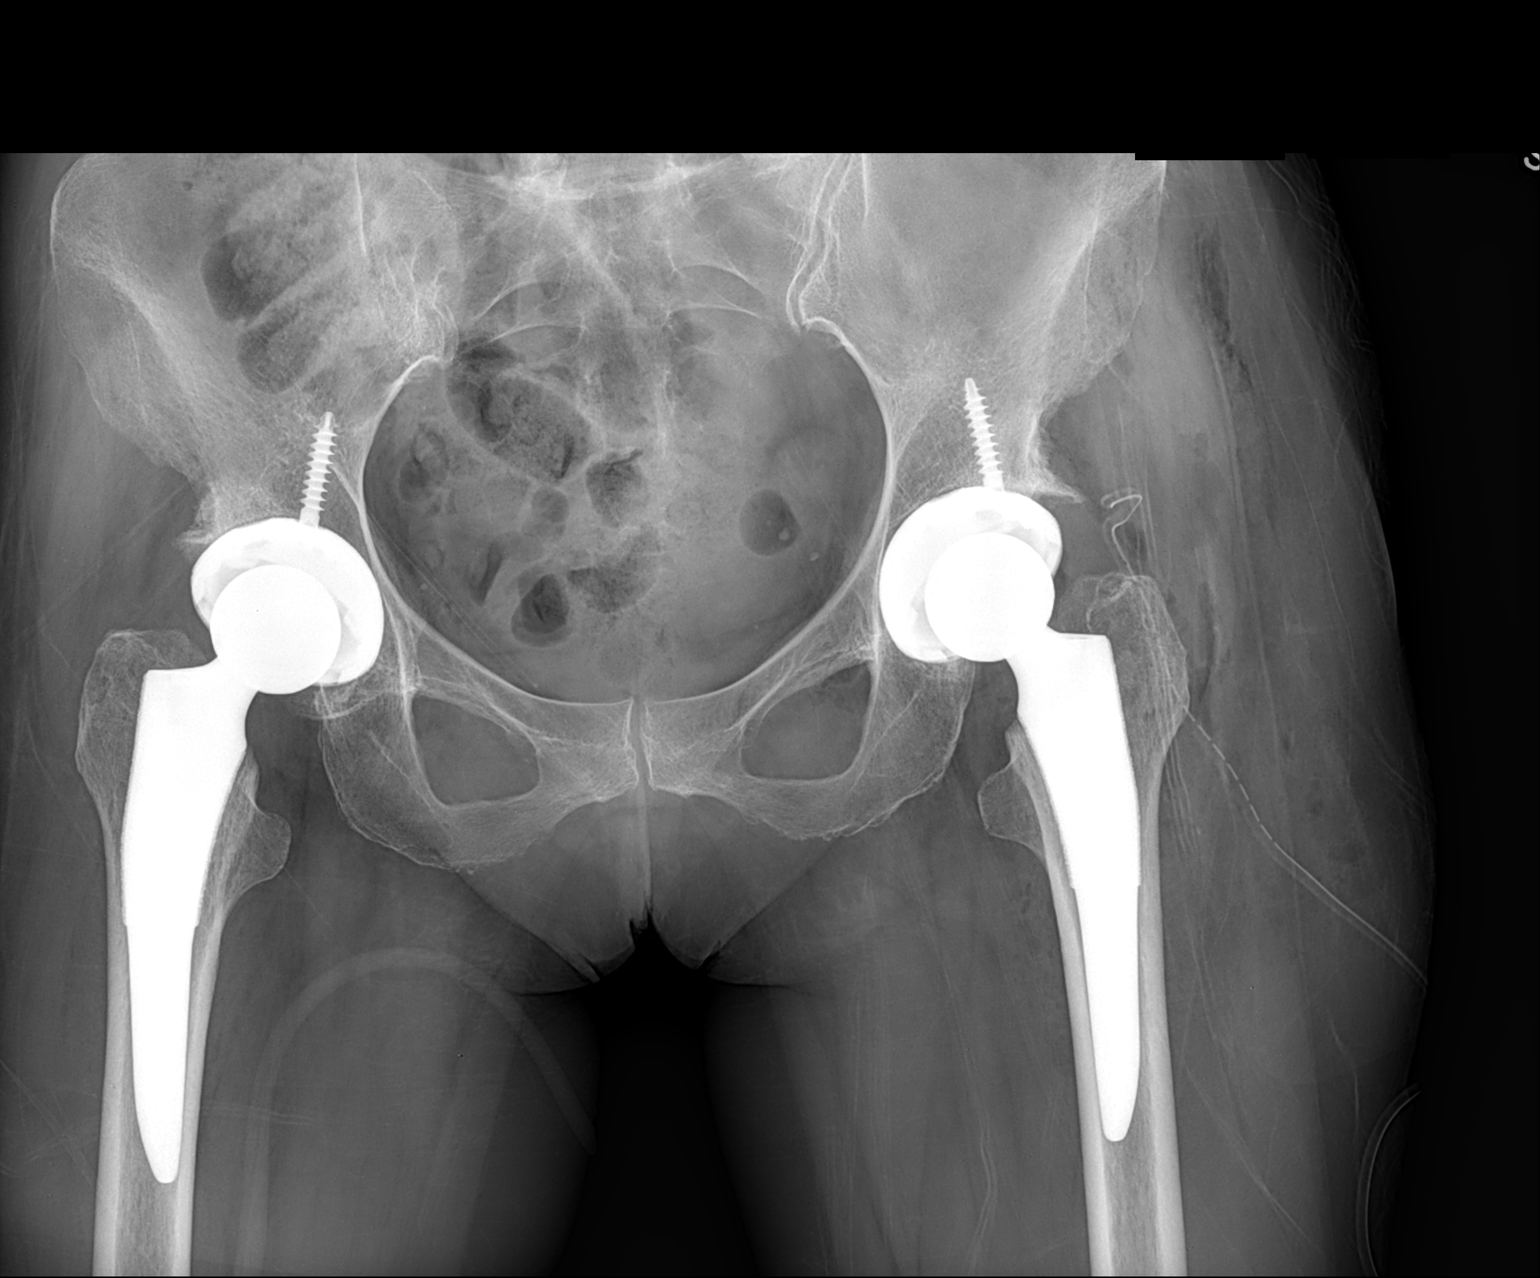

[1 of 1 positions shown; findings below may reference images not displayed]

FINDINGS: There has been interval left total hip arthroplasty.
Femoral stem appears well seated.  Surgical drain is in place.
Subcutaneous and joint air and fluid are present.  Previous right
total hip arthroplasty is unremarkable.  Degenerative changes are
seen in the lower lumbar spine.
IMPRESSION: Interval left total arthroplasty with expected postoperative
findings.

## 2013-07-24 DIAGNOSIS — B079 Viral wart, unspecified: Secondary | ICD-10-CM | POA: Diagnosis not present

## 2013-07-24 DIAGNOSIS — R079 Chest pain, unspecified: Secondary | ICD-10-CM | POA: Diagnosis not present

## 2013-07-24 DIAGNOSIS — Z0000 Encounter for general adult medical examination without abnormal findings: Secondary | ICD-10-CM | POA: Diagnosis not present

## 2013-07-24 DIAGNOSIS — M810 Age-related osteoporosis without current pathological fracture: Secondary | ICD-10-CM | POA: Diagnosis not present

## 2013-07-24 DIAGNOSIS — Z23 Encounter for immunization: Secondary | ICD-10-CM | POA: Diagnosis not present

## 2013-08-05 ENCOUNTER — Other Ambulatory Visit: Payer: Self-pay

## 2013-08-05 DIAGNOSIS — Z853 Personal history of malignant neoplasm of breast: Secondary | ICD-10-CM

## 2013-08-05 DIAGNOSIS — Z1231 Encounter for screening mammogram for malignant neoplasm of breast: Secondary | ICD-10-CM

## 2013-08-07 DIAGNOSIS — Z1231 Encounter for screening mammogram for malignant neoplasm of breast: Secondary | ICD-10-CM | POA: Diagnosis not present

## 2013-08-07 DIAGNOSIS — Z803 Family history of malignant neoplasm of breast: Secondary | ICD-10-CM | POA: Diagnosis not present

## 2013-08-15 ENCOUNTER — Ambulatory Visit: Payer: BC Managed Care – PPO

## 2013-08-15 DIAGNOSIS — Z124 Encounter for screening for malignant neoplasm of cervix: Secondary | ICD-10-CM | POA: Diagnosis not present

## 2013-08-15 DIAGNOSIS — Z01419 Encounter for gynecological examination (general) (routine) without abnormal findings: Secondary | ICD-10-CM | POA: Diagnosis not present

## 2013-08-20 ENCOUNTER — Institutional Professional Consult (permissible substitution): Payer: BC Managed Care – PPO | Admitting: Cardiology

## 2013-08-28 ENCOUNTER — Telehealth: Payer: Self-pay | Admitting: Genetic Counselor

## 2013-08-28 NOTE — Telephone Encounter (Signed)
LEFT MESSAGE FOR PATIENT TO RETURN CALL TO SCHEDULE GENETIC APPT.  °

## 2013-09-02 DIAGNOSIS — M810 Age-related osteoporosis without current pathological fracture: Secondary | ICD-10-CM | POA: Diagnosis not present

## 2013-09-05 ENCOUNTER — Institutional Professional Consult (permissible substitution): Payer: BC Managed Care – PPO | Admitting: Cardiology

## 2013-09-23 ENCOUNTER — Telehealth: Payer: Self-pay | Admitting: *Deleted

## 2013-09-23 NOTE — Telephone Encounter (Signed)
Pt called requested to cancel her genetic appt and she does not want to reschedule at this time.  She will call back at a later date if she wishes to reschedule.  Cancelled the appt as requested.

## 2013-09-27 ENCOUNTER — Other Ambulatory Visit: Payer: BC Managed Care – PPO

## 2013-11-01 ENCOUNTER — Telehealth: Payer: Self-pay | Admitting: Genetic Counselor

## 2013-11-01 NOTE — Telephone Encounter (Signed)
S/W PATIENT AND GAVE GENETIC APPT FOR 07/09 @ 1:30 W/GENETIC COUNSELOR.  WELCOME PACKET MAILED.

## 2013-11-28 ENCOUNTER — Other Ambulatory Visit: Payer: Self-pay

## 2014-03-11 DIAGNOSIS — Z23 Encounter for immunization: Secondary | ICD-10-CM | POA: Diagnosis not present

## 2015-01-15 DIAGNOSIS — R102 Pelvic and perineal pain: Secondary | ICD-10-CM | POA: Diagnosis not present

## 2015-01-16 DIAGNOSIS — Z23 Encounter for immunization: Secondary | ICD-10-CM | POA: Diagnosis not present

## 2015-01-29 DIAGNOSIS — Z124 Encounter for screening for malignant neoplasm of cervix: Secondary | ICD-10-CM | POA: Diagnosis not present

## 2015-02-19 DIAGNOSIS — H538 Other visual disturbances: Secondary | ICD-10-CM | POA: Diagnosis not present

## 2015-02-19 DIAGNOSIS — H35711 Central serous chorioretinopathy, right eye: Secondary | ICD-10-CM | POA: Diagnosis not present

## 2015-02-23 DIAGNOSIS — H353210 Exudative age-related macular degeneration, right eye, stage unspecified: Secondary | ICD-10-CM | POA: Diagnosis not present

## 2015-02-23 DIAGNOSIS — H35711 Central serous chorioretinopathy, right eye: Secondary | ICD-10-CM | POA: Diagnosis not present

## 2015-02-23 DIAGNOSIS — H353111 Nonexudative age-related macular degeneration, right eye, early dry stage: Secondary | ICD-10-CM | POA: Diagnosis not present

## 2015-02-26 DIAGNOSIS — H353211 Exudative age-related macular degeneration, right eye, with active choroidal neovascularization: Secondary | ICD-10-CM | POA: Diagnosis not present

## 2015-04-06 DIAGNOSIS — H353211 Exudative age-related macular degeneration, right eye, with active choroidal neovascularization: Secondary | ICD-10-CM | POA: Diagnosis not present

## 2015-05-12 DIAGNOSIS — H353211 Exudative age-related macular degeneration, right eye, with active choroidal neovascularization: Secondary | ICD-10-CM | POA: Diagnosis not present

## 2015-06-16 DIAGNOSIS — H353211 Exudative age-related macular degeneration, right eye, with active choroidal neovascularization: Secondary | ICD-10-CM | POA: Diagnosis not present

## 2015-07-21 DIAGNOSIS — H353211 Exudative age-related macular degeneration, right eye, with active choroidal neovascularization: Secondary | ICD-10-CM | POA: Diagnosis not present

## 2015-08-25 DIAGNOSIS — H353211 Exudative age-related macular degeneration, right eye, with active choroidal neovascularization: Secondary | ICD-10-CM | POA: Diagnosis not present

## 2015-09-24 DIAGNOSIS — D485 Neoplasm of uncertain behavior of skin: Secondary | ICD-10-CM | POA: Diagnosis not present

## 2015-09-24 DIAGNOSIS — L820 Inflamed seborrheic keratosis: Secondary | ICD-10-CM | POA: Diagnosis not present

## 2015-09-24 DIAGNOSIS — L821 Other seborrheic keratosis: Secondary | ICD-10-CM | POA: Diagnosis not present

## 2015-09-24 DIAGNOSIS — D239 Other benign neoplasm of skin, unspecified: Secondary | ICD-10-CM | POA: Diagnosis not present

## 2015-10-05 DIAGNOSIS — H353211 Exudative age-related macular degeneration, right eye, with active choroidal neovascularization: Secondary | ICD-10-CM | POA: Diagnosis not present

## 2015-11-17 DIAGNOSIS — R002 Palpitations: Secondary | ICD-10-CM | POA: Diagnosis not present

## 2015-11-17 DIAGNOSIS — W57XXXA Bitten or stung by nonvenomous insect and other nonvenomous arthropods, initial encounter: Secondary | ICD-10-CM | POA: Diagnosis not present

## 2015-11-17 DIAGNOSIS — S30861A Insect bite (nonvenomous) of abdominal wall, initial encounter: Secondary | ICD-10-CM | POA: Diagnosis not present

## 2015-11-30 DIAGNOSIS — H353211 Exudative age-related macular degeneration, right eye, with active choroidal neovascularization: Secondary | ICD-10-CM | POA: Diagnosis not present

## 2016-01-24 DIAGNOSIS — S61205A Unspecified open wound of left ring finger without damage to nail, initial encounter: Secondary | ICD-10-CM | POA: Diagnosis not present

## 2016-01-28 ENCOUNTER — Other Ambulatory Visit: Payer: Self-pay

## 2016-02-02 DIAGNOSIS — H353211 Exudative age-related macular degeneration, right eye, with active choroidal neovascularization: Secondary | ICD-10-CM | POA: Diagnosis not present

## 2016-02-06 DIAGNOSIS — Z4802 Encounter for removal of sutures: Secondary | ICD-10-CM | POA: Diagnosis not present

## 2016-04-05 DIAGNOSIS — H353211 Exudative age-related macular degeneration, right eye, with active choroidal neovascularization: Secondary | ICD-10-CM | POA: Diagnosis not present

## 2016-05-03 DIAGNOSIS — Z23 Encounter for immunization: Secondary | ICD-10-CM | POA: Diagnosis not present

## 2016-06-21 DIAGNOSIS — H353211 Exudative age-related macular degeneration, right eye, with active choroidal neovascularization: Secondary | ICD-10-CM | POA: Diagnosis not present

## 2016-09-14 DIAGNOSIS — H353212 Exudative age-related macular degeneration, right eye, with inactive choroidal neovascularization: Secondary | ICD-10-CM | POA: Diagnosis not present

## 2016-09-14 DIAGNOSIS — H353111 Nonexudative age-related macular degeneration, right eye, early dry stage: Secondary | ICD-10-CM | POA: Diagnosis not present

## 2016-09-14 DIAGNOSIS — H35361 Drusen (degenerative) of macula, right eye: Secondary | ICD-10-CM | POA: Diagnosis not present

## 2016-09-14 DIAGNOSIS — H353121 Nonexudative age-related macular degeneration, left eye, early dry stage: Secondary | ICD-10-CM | POA: Diagnosis not present

## 2016-11-04 DIAGNOSIS — H35361 Drusen (degenerative) of macula, right eye: Secondary | ICD-10-CM | POA: Diagnosis not present

## 2016-11-04 DIAGNOSIS — H353212 Exudative age-related macular degeneration, right eye, with inactive choroidal neovascularization: Secondary | ICD-10-CM | POA: Diagnosis not present

## 2016-11-04 DIAGNOSIS — H353111 Nonexudative age-related macular degeneration, right eye, early dry stage: Secondary | ICD-10-CM | POA: Diagnosis not present

## 2016-11-04 DIAGNOSIS — H353121 Nonexudative age-related macular degeneration, left eye, early dry stage: Secondary | ICD-10-CM | POA: Diagnosis not present

## 2017-04-04 DIAGNOSIS — Z23 Encounter for immunization: Secondary | ICD-10-CM | POA: Diagnosis not present

## 2017-05-04 DIAGNOSIS — H353212 Exudative age-related macular degeneration, right eye, with inactive choroidal neovascularization: Secondary | ICD-10-CM | POA: Diagnosis not present

## 2017-05-04 DIAGNOSIS — H353121 Nonexudative age-related macular degeneration, left eye, early dry stage: Secondary | ICD-10-CM | POA: Diagnosis not present

## 2017-05-04 DIAGNOSIS — H353111 Nonexudative age-related macular degeneration, right eye, early dry stage: Secondary | ICD-10-CM | POA: Diagnosis not present

## 2017-05-04 DIAGNOSIS — H35361 Drusen (degenerative) of macula, right eye: Secondary | ICD-10-CM | POA: Diagnosis not present

## 2019-01-03 ENCOUNTER — Telehealth: Payer: Self-pay

## 2019-01-03 NOTE — Telephone Encounter (Signed)
Left message for patient to call back to schedule appointment.
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
## 2019-01-03 NOTE — Telephone Encounter (Signed)
Error

## 2020-02-26 ENCOUNTER — Other Ambulatory Visit: Payer: Self-pay

## 2020-03-02 ENCOUNTER — Other Ambulatory Visit: Payer: Medicare Other

## 2020-03-02 DIAGNOSIS — Z20822 Contact with and (suspected) exposure to covid-19: Secondary | ICD-10-CM | POA: Diagnosis not present

## 2020-03-03 LAB — SARS-COV-2, NAA 2 DAY TAT

## 2020-03-03 LAB — NOVEL CORONAVIRUS, NAA: SARS-CoV-2, NAA: NOT DETECTED

## 2020-07-16 DIAGNOSIS — H6121 Impacted cerumen, right ear: Secondary | ICD-10-CM | POA: Diagnosis not present

## 2020-08-18 DIAGNOSIS — H353213 Exudative age-related macular degeneration, right eye, with inactive scar: Secondary | ICD-10-CM | POA: Diagnosis not present

## 2020-08-18 DIAGNOSIS — H353121 Nonexudative age-related macular degeneration, left eye, early dry stage: Secondary | ICD-10-CM | POA: Diagnosis not present

## 2020-08-18 DIAGNOSIS — H02052 Trichiasis without entropian right lower eyelid: Secondary | ICD-10-CM | POA: Diagnosis not present

## 2020-08-18 DIAGNOSIS — H5203 Hypermetropia, bilateral: Secondary | ICD-10-CM | POA: Diagnosis not present

## 2020-11-26 DIAGNOSIS — Z20822 Contact with and (suspected) exposure to covid-19: Secondary | ICD-10-CM | POA: Diagnosis not present

## 2021-01-28 DIAGNOSIS — Z23 Encounter for immunization: Secondary | ICD-10-CM | POA: Diagnosis not present

## 2021-03-18 DIAGNOSIS — Z1239 Encounter for other screening for malignant neoplasm of breast: Secondary | ICD-10-CM | POA: Diagnosis not present

## 2021-03-18 DIAGNOSIS — Z7689 Persons encountering health services in other specified circumstances: Secondary | ICD-10-CM | POA: Diagnosis not present

## 2021-04-07 DIAGNOSIS — Z23 Encounter for immunization: Secondary | ICD-10-CM | POA: Diagnosis not present

## 2021-06-22 DIAGNOSIS — Z1211 Encounter for screening for malignant neoplasm of colon: Secondary | ICD-10-CM | POA: Diagnosis not present

## 2021-06-22 DIAGNOSIS — M8588 Other specified disorders of bone density and structure, other site: Secondary | ICD-10-CM | POA: Diagnosis not present

## 2021-06-22 DIAGNOSIS — E7800 Pure hypercholesterolemia, unspecified: Secondary | ICD-10-CM | POA: Diagnosis not present

## 2021-06-22 DIAGNOSIS — Z13820 Encounter for screening for osteoporosis: Secondary | ICD-10-CM | POA: Diagnosis not present

## 2021-06-22 DIAGNOSIS — E559 Vitamin D deficiency, unspecified: Secondary | ICD-10-CM | POA: Diagnosis not present

## 2021-06-22 DIAGNOSIS — Z0000 Encounter for general adult medical examination without abnormal findings: Secondary | ICD-10-CM | POA: Diagnosis not present

## 2021-06-23 DIAGNOSIS — E559 Vitamin D deficiency, unspecified: Secondary | ICD-10-CM | POA: Diagnosis not present

## 2021-06-23 DIAGNOSIS — E7800 Pure hypercholesterolemia, unspecified: Secondary | ICD-10-CM | POA: Diagnosis not present

## 2021-06-23 DIAGNOSIS — M8588 Other specified disorders of bone density and structure, other site: Secondary | ICD-10-CM | POA: Diagnosis not present

## 2021-06-24 ENCOUNTER — Other Ambulatory Visit: Payer: Self-pay | Admitting: Internal Medicine

## 2021-06-24 DIAGNOSIS — M8588 Other specified disorders of bone density and structure, other site: Secondary | ICD-10-CM

## 2021-07-05 DIAGNOSIS — Z1211 Encounter for screening for malignant neoplasm of colon: Secondary | ICD-10-CM | POA: Diagnosis not present

## 2021-07-13 LAB — COLOGUARD: COLOGUARD: POSITIVE — AB

## 2021-07-13 LAB — EXTERNAL GENERIC LAB PROCEDURE: COLOGUARD: POSITIVE — AB

## 2021-07-20 DIAGNOSIS — Z20822 Contact with and (suspected) exposure to covid-19: Secondary | ICD-10-CM | POA: Diagnosis not present

## 2021-10-26 DIAGNOSIS — H2513 Age-related nuclear cataract, bilateral: Secondary | ICD-10-CM | POA: Diagnosis not present

## 2021-10-26 DIAGNOSIS — H5203 Hypermetropia, bilateral: Secondary | ICD-10-CM | POA: Diagnosis not present

## 2021-10-26 DIAGNOSIS — H02831 Dermatochalasis of right upper eyelid: Secondary | ICD-10-CM | POA: Diagnosis not present

## 2021-10-26 DIAGNOSIS — H353131 Nonexudative age-related macular degeneration, bilateral, early dry stage: Secondary | ICD-10-CM | POA: Diagnosis not present

## 2022-03-15 DIAGNOSIS — Z23 Encounter for immunization: Secondary | ICD-10-CM | POA: Diagnosis not present

## 2022-09-29 DIAGNOSIS — L820 Inflamed seborrheic keratosis: Secondary | ICD-10-CM | POA: Diagnosis not present

## 2022-09-29 DIAGNOSIS — L570 Actinic keratosis: Secondary | ICD-10-CM | POA: Diagnosis not present

## 2022-09-29 DIAGNOSIS — L821 Other seborrheic keratosis: Secondary | ICD-10-CM | POA: Diagnosis not present

## 2022-09-29 DIAGNOSIS — I8391 Asymptomatic varicose veins of right lower extremity: Secondary | ICD-10-CM | POA: Diagnosis not present

## 2022-09-29 DIAGNOSIS — I8392 Asymptomatic varicose veins of left lower extremity: Secondary | ICD-10-CM | POA: Diagnosis not present

## 2023-04-04 ENCOUNTER — Ambulatory Visit (INDEPENDENT_AMBULATORY_CARE_PROVIDER_SITE_OTHER): Payer: Medicare Other | Admitting: Sports Medicine

## 2023-04-04 ENCOUNTER — Other Ambulatory Visit: Payer: Self-pay

## 2023-04-04 ENCOUNTER — Encounter: Payer: Self-pay | Admitting: Sports Medicine

## 2023-04-04 VITALS — BP 134/80 | HR 87 | Temp 97.2°F | Resp 16 | Ht 64.17 in | Wt 121.0 lb

## 2023-04-04 DIAGNOSIS — Z1231 Encounter for screening mammogram for malignant neoplasm of breast: Secondary | ICD-10-CM

## 2023-04-04 DIAGNOSIS — C50912 Malignant neoplasm of unspecified site of left female breast: Secondary | ICD-10-CM | POA: Diagnosis not present

## 2023-04-04 DIAGNOSIS — N959 Unspecified menopausal and perimenopausal disorder: Secondary | ICD-10-CM | POA: Diagnosis not present

## 2023-04-04 DIAGNOSIS — Z131 Encounter for screening for diabetes mellitus: Secondary | ICD-10-CM

## 2023-04-04 DIAGNOSIS — Z13220 Encounter for screening for lipoid disorders: Secondary | ICD-10-CM | POA: Diagnosis not present

## 2023-04-04 DIAGNOSIS — Z8249 Family history of ischemic heart disease and other diseases of the circulatory system: Secondary | ICD-10-CM

## 2023-04-04 DIAGNOSIS — Z113 Encounter for screening for infections with a predominantly sexual mode of transmission: Secondary | ICD-10-CM

## 2023-04-04 DIAGNOSIS — H3530 Unspecified macular degeneration: Secondary | ICD-10-CM

## 2023-04-04 NOTE — Progress Notes (Unsigned)
Careteam: Patient Care Team: Merlene Laughter, MD (Inactive) as PCP - General (Internal Medicine) Genia Del, MD as Referring Physician (Obstetrics and Gynecology)  PLACE OF SERVICE:  [facility]  Advanced Directive information Does Patient Have a Medical Advance Directive?: Yes, Type of Advance Directive: Living will;Healthcare Power of Attorney, Does patient want to make changes to medical advance directive?: No - Patient declined  No Known Allergies  Chief Complaint  Patient presents with   Establish Care    New Patient.      HPI: Patient is a 77 y.o. 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  HENT:  Negative for congestion and sore throat.   Eyes:  Negative for double vision.  Respiratory:  Negative for cough, sputum production and shortness of breath.   Cardiovascular:  Negative for chest pain, palpitations and leg swelling.  Gastrointestinal:  Negative for abdominal pain, heartburn and nausea.  Genitourinary:  Negative for dysuria, frequency and hematuria.  Musculoskeletal:  Negative for falls and myalgias.  Neurological:  Negative for dizziness, sensory change and focal weakness.     Past Medical History:  Diagnosis Date   Macular degeneration    History reviewed. No  pertinent surgical history. Social History:   reports that she has never smoked. She has never used smokeless tobacco. She reports that she does not currently use alcohol. She reports that she does not use drugs.  Family History  Problem Relation Age of Onset   Breast cancer Mother    Heart attack Father     Medications: Patient's Medications  New Prescriptions   No medications on file  Previous Medications   B COMPLEX VITAMINS (B COMPLEX 50 PO)    Take 1 capsule by mouth every other day.   CALCIUM-MAGNESIUM-VITAMIN D (CALCIUM MAGNESIUM PO)    Take 1 capsule by mouth daily. 1000mg , 500mg , (400I.U)   CYANOCOBALAMIN (VITAMIN B12 PO)    Take 2,500 mcg by mouth every other day.   OVER THE COUNTER MEDICATION    Take 1 capsule by mouth daily. HOST DEFENSE  MUSHROOMS MYCOBOTANICALS BRAIN   VITAMIN E 180 MG (400 UNITS) CAPSULE    Take 400 Units by mouth daily.  Modified Medications   No medications on file  Discontinued Medications   No medications on file    Physical Exam:  Vitals:   04/04/23 1004  BP: 134/80  Pulse: 87  Resp: 16  Temp: (!) 97.2 F (36.2 C)  SpO2: 98%  Weight: 121 lb (54.9 kg)  Height: 5' 4.17" (1.63 m)   Body mass index is 20.66 kg/m. Wt Readings from Last 3 Encounters:  04/04/23 121 lb (54.9 kg)    Physical Exam Constitutional:  Appearance: Normal appearance.  HENT:     Head: Normocephalic and atraumatic.  Cardiovascular:     Rate and Rhythm: Normal rate and regular rhythm.     Heart sounds: No murmur heard. Pulmonary:     Effort: Pulmonary effort is normal. No respiratory distress.     Breath sounds: Normal breath sounds. No wheezing.  Abdominal:     General: Bowel sounds are normal. There is no distension.     Tenderness: There is no abdominal tenderness. There is no guarding or rebound.  Musculoskeletal:        General: No swelling or tenderness.  Skin:    General: Skin is dry.  Neurological:     Mental Status: She is alert.  Mental status is at baseline.     Sensory: No sensory deficit.     Motor: No weakness.      Labs reviewed: Basic Metabolic Panel: No results for input(s): "NA", "K", "CL", "CO2", "GLUCOSE", "BUN", "CREATININE", "CALCIUM", "MG", "PHOS", "TSH" in the last 8760 hours. Liver Function Tests: No results for input(s): "AST", "ALT", "ALKPHOS", "BILITOT", "PROT", "ALBUMIN" in the last 8760 hours. No results for input(s): "LIPASE", "AMYLASE" in the last 8760 hours. No results for input(s): "AMMONIA" in the last 8760 hours. CBC: No results for input(s): "WBC", "NEUTROABS", "HGB", "HCT", "MCV", "PLT" in the last 8760 hours. Lipid Panel: No results for input(s): "CHOL", "HDL", "LDLCALC", "TRIG", "CHOLHDL", "LDLDIRECT" in the last 8760 hours. TSH: No results for input(s): "TSH" in the last 8760 hours. A1C: No results found for: "HGBA1C"   Assessment/Plan  1. Malignant neoplasm of left female breast, unspecified estrogen receptor status, unspecified site of breast (HCC)  Last mammogram many years ago  Will order cbc, bmp Will order Mammogram - CBC With Differential/Platelet - Complete Metabolic Panel with eGFR - DG Bone Density; Future  2. Screening for diabetes mellitus   - Hemoglobin A1C  3. Screening for lipid disorders   - Lipid Panel  4. Family history of heart attack   - Lipid Panel  5. Macular degeneration, unspecified laterality, unspecified type   - Ambulatory referral to Ophthalmology  6. Breast cancer screening by mammogram   - MM 3D SCREENING MAMMOGRAM BILATERAL BREAST; Future  7. Unspecified menopausal and perimenopausal disorder   - DG Bone Density; Future  8. Screening for STD (sexually transmitted disease)   - Hepatitis C antibody  Other orders - Calcium-Magnesium-Vitamin D (CALCIUM MAGNESIUM PO); Take 1 capsule by mouth daily. 1000mg , 500mg , (400I.U) - OVER THE COUNTER MEDICATION; Take 1 capsule by mouth daily. HOST DEFENSE  MUSHROOMS  MYCOBOTANICALS BRAIN - vitamin E 180 MG (400 UNITS) capsule; Take 400 Units by mouth daily. - Cyanocobalamin (VITAMIN B12 PO); Take 2,500 mcg by mouth every other day. - B Complex Vitamins (B COMPLEX 50 PO); Take 1 capsule by mouth every other day.   No follow-ups on file.: 4 months    I spent greater than  45 minutes for the care of this patient in face to face time, chart review, clinical documentation, patient education.

## 2023-04-04 NOTE — Patient Instructions (Signed)
PLEASE SCHEDULE FOR MWV, LAB WORK

## 2023-04-05 ENCOUNTER — Other Ambulatory Visit: Payer: Self-pay | Admitting: Sports Medicine

## 2023-04-05 DIAGNOSIS — Z1231 Encounter for screening mammogram for malignant neoplasm of breast: Secondary | ICD-10-CM

## 2023-04-07 ENCOUNTER — Other Ambulatory Visit: Payer: Self-pay | Admitting: *Deleted

## 2023-04-07 ENCOUNTER — Telehealth: Payer: Self-pay | Admitting: Sports Medicine

## 2023-04-07 ENCOUNTER — Ambulatory Visit
Admission: RE | Admit: 2023-04-07 | Discharge: 2023-04-07 | Disposition: A | Discharge status: Home / Self Care | Payer: Medicare Other | Source: Ambulatory Visit | Attending: Sports Medicine

## 2023-04-07 DIAGNOSIS — Z23 Encounter for immunization: Secondary | ICD-10-CM | POA: Diagnosis not present

## 2023-04-07 DIAGNOSIS — C50912 Malignant neoplasm of unspecified site of left female breast: Secondary | ICD-10-CM

## 2023-04-07 DIAGNOSIS — E2839 Other primary ovarian failure: Secondary | ICD-10-CM | POA: Diagnosis not present

## 2023-04-07 DIAGNOSIS — N959 Unspecified menopausal and perimenopausal disorder: Secondary | ICD-10-CM

## 2023-04-07 DIAGNOSIS — N958 Other specified menopausal and perimenopausal disorders: Secondary | ICD-10-CM | POA: Diagnosis not present

## 2023-04-07 DIAGNOSIS — M8588 Other specified disorders of bone density and structure, other site: Secondary | ICD-10-CM | POA: Diagnosis not present

## 2023-04-07 MED ORDER — ALENDRONATE SODIUM 70 MG PO TABS: 70.0000 mg | ORAL_TABLET | ORAL | 1 refills | Status: DC

## 2023-04-07 NOTE — Telephone Encounter (Signed)
   Maria Sheffield, MD  to [facility]     04/07/23 11:18 AM Plz call the patient and inform that bone density showed osteoporosis, she needs to start fosamax once a week and take calcium ad vit D daily Fosamax is usually well tolerated , most common side effect is  acid reflux,  but rare side effect is it can cause necrosis of jaw bone but incidence is 1 in 300,000 Pt needs to have blood work done to see her kidney function, labs ordered on her visit with me

## 2023-04-07 NOTE — Telephone Encounter (Signed)
Patient notified and agreed. Patient next appointment 05/29/22. Lab appointment scheduled for 1/3 and orders placed and released. Medication list updated with Fosamax and Rx sent to pharmacy as requested.  Patient is already taking Calcium with Vitamin D and stated that she will call with the Correct dosage.

## 2023-04-11 DIAGNOSIS — H2513 Age-related nuclear cataract, bilateral: Secondary | ICD-10-CM | POA: Diagnosis not present

## 2023-04-11 DIAGNOSIS — H353211 Exudative age-related macular degeneration, right eye, with active choroidal neovascularization: Secondary | ICD-10-CM | POA: Diagnosis not present

## 2023-04-11 DIAGNOSIS — H353121 Nonexudative age-related macular degeneration, left eye, early dry stage: Secondary | ICD-10-CM | POA: Diagnosis not present

## 2023-04-11 DIAGNOSIS — H5203 Hypermetropia, bilateral: Secondary | ICD-10-CM | POA: Diagnosis not present

## 2023-04-13 DIAGNOSIS — H353211 Exudative age-related macular degeneration, right eye, with active choroidal neovascularization: Secondary | ICD-10-CM | POA: Diagnosis not present

## 2023-04-13 DIAGNOSIS — H353132 Nonexudative age-related macular degeneration, bilateral, intermediate dry stage: Secondary | ICD-10-CM | POA: Diagnosis not present

## 2023-04-13 DIAGNOSIS — H353213 Exudative age-related macular degeneration, right eye, with inactive scar: Secondary | ICD-10-CM | POA: Diagnosis not present

## 2023-05-09 ENCOUNTER — Ambulatory Visit: Payer: Medicare Other

## 2023-05-25 ENCOUNTER — Ambulatory Visit
Admission: RE | Admit: 2023-05-25 | Discharge: 2023-05-25 | Disposition: A | Discharge status: Home / Self Care | Payer: Medicare Other | Source: Ambulatory Visit | Attending: Sports Medicine | Admitting: Sports Medicine

## 2023-05-25 ENCOUNTER — Other Ambulatory Visit: Payer: Self-pay | Admitting: Sports Medicine

## 2023-05-25 DIAGNOSIS — Z1231 Encounter for screening mammogram for malignant neoplasm of breast: Secondary | ICD-10-CM | POA: Diagnosis not present

## 2023-05-26 ENCOUNTER — Other Ambulatory Visit: Payer: Medicare Other

## 2023-05-26 DIAGNOSIS — Z13220 Encounter for screening for lipoid disorders: Secondary | ICD-10-CM | POA: Diagnosis not present

## 2023-05-26 DIAGNOSIS — Z8249 Family history of ischemic heart disease and other diseases of the circulatory system: Secondary | ICD-10-CM | POA: Diagnosis not present

## 2023-05-26 DIAGNOSIS — Z131 Encounter for screening for diabetes mellitus: Secondary | ICD-10-CM | POA: Diagnosis not present

## 2023-05-26 DIAGNOSIS — E782 Mixed hyperlipidemia: Secondary | ICD-10-CM | POA: Diagnosis not present

## 2023-05-26 DIAGNOSIS — C50912 Malignant neoplasm of unspecified site of left female breast: Secondary | ICD-10-CM | POA: Diagnosis not present

## 2023-05-27 LAB — CBC WITH DIFFERENTIAL/PLATELET
Absolute Lymphocytes: 1890 {cells}/uL (ref 850–3900)
Absolute Monocytes: 460 {cells}/uL (ref 200–950)
Basophils Absolute: 69 {cells}/uL (ref 0–200)
Basophils Relative: 1.1 %
Eosinophils Absolute: 139 {cells}/uL (ref 15–500)
Eosinophils Relative: 2.2 %
HCT: 40.3 % (ref 35.0–45.0)
Hemoglobin: 13.1 g/dL (ref 11.7–15.5)
MCH: 29.2 pg (ref 27.0–33.0)
MCHC: 32.5 g/dL (ref 32.0–36.0)
MCV: 90 fL (ref 80.0–100.0)
MPV: 10.2 fL (ref 7.5–12.5)
Monocytes Relative: 7.3 %
Neutro Abs: 3742 {cells}/uL (ref 1500–7800)
Neutrophils Relative %: 59.4 %
Platelets: 335 10*3/uL (ref 140–400)
RBC: 4.48 10*6/uL (ref 3.80–5.10)
RDW: 12.3 % (ref 11.0–15.0)
Total Lymphocyte: 30 %
WBC: 6.3 10*3/uL (ref 3.8–10.8)

## 2023-05-27 LAB — COMPLETE METABOLIC PANEL WITH GFR
AG Ratio: 2.3 (calc) (ref 1.0–2.5)
ALT: 13 U/L (ref 6–29)
AST: 13 U/L (ref 10–35)
Albumin: 4.8 g/dL (ref 3.6–5.1)
Alkaline phosphatase (APISO): 66 U/L (ref 37–153)
BUN: 14 mg/dL (ref 7–25)
CO2: 26 mmol/L (ref 20–32)
Calcium: 10.2 mg/dL (ref 8.6–10.4)
Chloride: 104 mmol/L (ref 98–110)
Creat: 0.8 mg/dL (ref 0.60–1.00)
Globulin: 2.1 g/dL (ref 1.9–3.7)
Glucose, Bld: 83 mg/dL (ref 65–99)
Potassium: 4.3 mmol/L (ref 3.5–5.3)
Sodium: 140 mmol/L (ref 135–146)
Total Bilirubin: 0.6 mg/dL (ref 0.2–1.2)
Total Protein: 6.9 g/dL (ref 6.1–8.1)
eGFR: 77 mL/min/{1.73_m2} (ref >=60)

## 2023-05-27 LAB — LIPID PANEL
Cholesterol: 216 mg/dL — ABNORMAL HIGH (ref <200)
HDL: 77 mg/dL (ref >=50)
LDL Cholesterol (Calc): 122 mg/dL — ABNORMAL HIGH
Non-HDL Cholesterol (Calc): 139 mg/dL — ABNORMAL HIGH (ref <130)
Total CHOL/HDL Ratio: 2.8 (calc) (ref <5.0)
Triglycerides: 78 mg/dL (ref <150)

## 2023-05-27 LAB — HEMOGLOBIN A1C
Hgb A1c MFr Bld: 5.4 %{Hb} (ref <5.7)
Mean Plasma Glucose: 108 mg/dL
eAG (mmol/L): 6 mmol/L

## 2023-05-27 LAB — HEPATITIS C ANTIBODY: Hepatitis C Ab: NONREACTIVE

## 2023-05-29 ENCOUNTER — Other Ambulatory Visit: Payer: Self-pay | Admitting: Sports Medicine

## 2023-05-29 MED ORDER — ATORVASTATIN CALCIUM 20 MG PO TABS
20.0000 mg | ORAL_TABLET | Freq: Every day | ORAL | 3 refills | Status: DC
Start: 2023-05-29 — End: 2023-05-30

## 2023-05-29 NOTE — Progress Notes (Signed)
 The 10-year ASCVD risk score (Arnett DK, et al., 2019) is: 17.6%   Values used to calculate the score:     Age: 78 years     Sex: Female     Is Non-Hispanic African American: No     Diabetic: No     Tobacco smoker: No     Systolic Blood Pressure: 134 mmHg     Is BP treated: No     HDL Cholesterol: 77 mg/dL     Total Cholesterol: 216 mg/dL   Will start lipitor 20 mg

## 2023-05-30 ENCOUNTER — Encounter: Payer: Self-pay | Admitting: Sports Medicine

## 2023-05-30 ENCOUNTER — Ambulatory Visit (INDEPENDENT_AMBULATORY_CARE_PROVIDER_SITE_OTHER): Payer: Medicare Other | Admitting: Sports Medicine

## 2023-05-30 VITALS — BP 118/76 | HR 84 | Temp 97.4°F | Ht 64.17 in | Wt 116.0 lb

## 2023-05-30 DIAGNOSIS — M810 Age-related osteoporosis without current pathological fracture: Secondary | ICD-10-CM

## 2023-05-30 DIAGNOSIS — E782 Mixed hyperlipidemia: Secondary | ICD-10-CM | POA: Diagnosis not present

## 2023-05-30 DIAGNOSIS — R928 Other abnormal and inconclusive findings on diagnostic imaging of breast: Secondary | ICD-10-CM | POA: Diagnosis not present

## 2023-05-30 NOTE — Progress Notes (Addendum)
 Careteam: Patient Care Team: Sherlynn Madden, MD as PCP - General (Internal Medicine) Lavoie, Marie-Lyne, MD as Referring Physician (Obstetrics and Gynecology) Leslee Reusing, MD as Consulting Physician (Ophthalmology)  PLACE OF SERVICE:  [facility]  Advanced Directive information Does Patient Have a Medical Advance Directive?: Yes, Type of Advance Directive: Healthcare Power of [place];Living will, Does patient want to make changes to medical advance directive?: No - Patient declined  Allergies  Allergen Reactions   Duricef [Cefadroxil]    Latex     Chief Complaint  Patient presents with   Follow-up    Discuss Bone Density and Labs results.    Discussed the use of AI scribe software for clinical note transcription with the patient, who gave verbal consent to proceed.  History of Present Illness   The patient, with a history of osteoporosis and hypercholesterolemia, presents for a follow-up on recent blood work. She reports overall good health and denies any recent illnesses or symptoms, with the exception of a brief episode of diarrhea around Christmas, which she self-managed with an elimination diet. The patient denies any current gastrointestinal symptoms.  The patient is actively managing her osteoporosis with a regimen of weight-bearing exercises,  ,  She also takes calcium  and vitamin D supplements. She has declined the use of Fosamax , expressing concerns regarding side effects.   Regarding her hypercholesterolemia, the patient has chosen not to take prescribed cholesterol medication, citing a preference for dietary management .  The patient is physically active, walking briskly for 15-20 minutes most days and lifting weights three times a week. She denies any chest pain, shortness of breath, dizziness, or lightheadedness during these activities. She also denies any urinary symptoms or joint pain.  The patient's mood is reportedly good, despite the ongoing stress  of her husband's cognitive decline. She reports good sleep, strong family and social connections, and a daily routine that includes regular exercise. She has not experienced any falls in the past year.          Review of Systems:  Review of Systems  Constitutional:  Negative for chills and fever.  HENT:  Negative for congestion and sore throat.   Eyes:  Negative for double vision.  Respiratory:  Negative for cough, sputum production and shortness of breath.   Cardiovascular:  Negative for chest pain, palpitations and leg swelling.  Gastrointestinal:  Negative for abdominal pain, heartburn and nausea.  Genitourinary:  Negative for dysuria, frequency and hematuria.  Musculoskeletal:  Negative for falls and myalgias.  Neurological:  Negative for dizziness, sensory change and focal weakness.   Negative unless indicated in HPI.   Past Medical History:  Diagnosis Date   History of breast cancer    History of colonoscopy    History of mammogram    Macular degeneration    Past Surgical History:  Procedure Laterality Date   APPENDECTOMY     HIP SURGERY     Replacement   MASTECTOMY Left    OVARY SURGERY     Social History:   reports that she has never smoked. She has never used smokeless tobacco. She reports that she does not currently use alcohol. She reports that she does not use drugs.  Family History  Problem Relation Age of Onset   Breast cancer Mother    Heart attack Father     Medications: Patient's Medications  New Prescriptions   No medications on file  Previous Medications   B COMPLEX VITAMINS (B COMPLEX 50 PO)    Take  1 capsule by mouth every other day.   CALCIUM -MAGNESIUM-VITAMIN D (CALCIUM  MAGNESIUM PO)    Take 1 capsule by mouth daily. 1000mg , 500mg , (400I.U)   CYANOCOBALAMIN (VITAMIN B12 PO)    Take 2,500 mcg by mouth every other day.   OVER THE COUNTER MEDICATION    Take 1 capsule by mouth daily. HOST DEFENSE  MUSHROOMS MYCOBOTANICALS BRAIN   VITAMIN E  180 MG (400 UNITS) CAPSULE    Take 400 Units by mouth daily.  Modified Medications   No medications on file  Discontinued Medications   ALENDRONATE  (FOSAMAX ) 70 MG TABLET    Take 1 tablet (70 mg total) by mouth once a week. Take with a full glass of water on an empty stomach.   ATORVASTATIN  (LIPITOR) 20 MG TABLET    Take 1 tablet (20 mg total) by mouth daily.    Physical Exam: Vitals:   05/30/23 0927  BP: 118/76  Pulse: 84  Temp: (!) 97.4 F (36.3 C)  SpO2: 98%  Weight: 116 lb (52.6 kg)  Height: 5' 4.17 (1.63 m)   Body mass index is 19.81 kg/m. BP Readings from Last 3 Encounters:  05/30/23 118/76  04/04/23 134/80   Wt Readings from Last 3 Encounters:  05/30/23 116 lb (52.6 kg)  04/04/23 121 lb (54.9 kg)    Physical Exam Constitutional:      Appearance: Normal appearance.  HENT:     Head: Normocephalic and atraumatic.  Cardiovascular:     Rate and Rhythm: Normal rate and regular rhythm.  Pulmonary:     Effort: Pulmonary effort is normal. No respiratory distress.     Breath sounds: Normal breath sounds. No wheezing.  Abdominal:     General: Bowel sounds are normal. There is no distension.     Tenderness: There is no abdominal tenderness. There is no guarding or rebound.     Comments:    Musculoskeletal:        General: No swelling or tenderness.  Skin:    General: Skin is dry.  Neurological:     Mental Status: She is alert. Mental status is at baseline.     Sensory: No sensory deficit.     Motor: No weakness.     Labs reviewed: Basic Metabolic Panel: Recent Labs    05/26/23 0820  NA 140  K 4.3  CL 104  CO2 26  GLUCOSE 83  BUN 14  CREATININE 0.80  CALCIUM  10.2   Liver Function Tests: Recent Labs    05/26/23 0820  AST 13  ALT 13  BILITOT 0.6  PROT 6.9   No results for input(s): LIPASE, AMYLASE in the last 8760 hours. No results for input(s): AMMONIA in the last 8760 hours. CBC: Recent Labs    05/26/23 0820  WBC 6.3  NEUTROABS  3,742  HGB 13.1  HCT 40.3  MCV 90.0  PLT 335   Lipid Panel: Recent Labs    05/26/23 0820  CHOL 216*  HDL 77  LDLCALC 122*  TRIG 78  CHOLHDL 2.8   TSH: No results for input(s): TSH in the last 8760 hours. A1C: Lab Results  Component Value Date   HGBA1C 5.4 05/26/2023     Assessment/Plan  Hyperlipidemia  The 10-year ASCVD risk score (Arnett DK, et al., 2019) is: 14%   Values used to calculate the score:     Age: 87 years     Sex: Female     Is Non-Hispanic African American: No     Diabetic:  No     Tobacco smoker: No     Systolic Blood Pressure: 118 mmHg     Is BP treated: No     HDL Cholesterol: 77 mg/dL     Total Cholesterol: 216 mg/dL   Patient declined statin therapy, preferring to manage with diet and exercise. -Encouraged patient to continue with diet and exercise regimen.  Osteoporosis T-score -3, indicating osteoporosis.  Patient declined Fosamax , preferring to manage with  weight bearing exercises calcium , vitamin D,  -Encouraged patient to continue with current regimen and consider increasing frequency of weight-bearing exercises.  Mammogram Pending results due to need for comparison with previous mammogram. -Will update patient once results are available.  General Health Maintenance -Encouraged patient to bring in documentation of COVID vaccination for record update. -Plan to see patient back in clinic in 6 months to 1 year, or sooner if any changes or concerns arise. Return in about 6 months (around 11/27/2023) for schedule for mwv.:

## 2023-06-07 DIAGNOSIS — H353211 Exudative age-related macular degeneration, right eye, with active choroidal neovascularization: Secondary | ICD-10-CM | POA: Diagnosis not present

## 2023-06-07 DIAGNOSIS — H353132 Nonexudative age-related macular degeneration, bilateral, intermediate dry stage: Secondary | ICD-10-CM | POA: Diagnosis not present

## 2023-06-07 DIAGNOSIS — H02833 Dermatochalasis of right eye, unspecified eyelid: Secondary | ICD-10-CM | POA: Diagnosis not present

## 2023-06-07 DIAGNOSIS — H02836 Dermatochalasis of left eye, unspecified eyelid: Secondary | ICD-10-CM | POA: Diagnosis not present

## 2023-07-20 DIAGNOSIS — H353211 Exudative age-related macular degeneration, right eye, with active choroidal neovascularization: Secondary | ICD-10-CM | POA: Diagnosis not present

## 2023-07-20 DIAGNOSIS — H353213 Exudative age-related macular degeneration, right eye, with inactive scar: Secondary | ICD-10-CM | POA: Diagnosis not present

## 2023-07-20 DIAGNOSIS — H02833 Dermatochalasis of right eye, unspecified eyelid: Secondary | ICD-10-CM | POA: Diagnosis not present

## 2023-07-20 DIAGNOSIS — H02836 Dermatochalasis of left eye, unspecified eyelid: Secondary | ICD-10-CM | POA: Diagnosis not present

## 2023-07-20 DIAGNOSIS — H353132 Nonexudative age-related macular degeneration, bilateral, intermediate dry stage: Secondary | ICD-10-CM | POA: Diagnosis not present

## 2023-07-20 DIAGNOSIS — H2513 Age-related nuclear cataract, bilateral: Secondary | ICD-10-CM | POA: Diagnosis not present

## 2023-07-27 ENCOUNTER — Other Ambulatory Visit: Payer: Self-pay

## 2023-07-27 ENCOUNTER — Ambulatory Visit (INDEPENDENT_AMBULATORY_CARE_PROVIDER_SITE_OTHER): Admitting: Family Medicine

## 2023-07-27 ENCOUNTER — Encounter: Payer: Self-pay | Admitting: Family Medicine

## 2023-07-27 VITALS — BP 136/80 | HR 65 | Ht 64.17 in | Wt 117.0 lb

## 2023-07-27 DIAGNOSIS — G8929 Other chronic pain: Secondary | ICD-10-CM

## 2023-07-27 DIAGNOSIS — M25511 Pain in right shoulder: Secondary | ICD-10-CM | POA: Diagnosis not present

## 2023-07-27 DIAGNOSIS — M810 Age-related osteoporosis without current pathological fracture: Secondary | ICD-10-CM | POA: Insufficient documentation

## 2023-07-27 NOTE — Patient Instructions (Addendum)
 Thank you for coming in today.   Please work on the home exercises the athletic trainer went over with you:  View at [email or web address] using code: [email or web address]  Let me know if this is not improving

## 2023-07-27 NOTE — Progress Notes (Signed)
   I, Stevenson Clinch, CMA acting as a scribe for Clementeen Graham, MD.  [name] is a 78 y.o. female who presents to [facility] at [facility] today for R shoulder pain x 3 weeks, denied injury. Pt locates pain to lateral aspect of the shoulder. Mechanical sx present. Denies radicular sx. Notes more clicking and popping than pain. Sx tend to be worse first thing in the morning.  She denies much pain.  She is an avid exerciser.  She is doing osteo strong for osteoporosis and doing some independent weight lifting.  Radiates: no Aggravates:  Treatments tried: Modification of activity  Dx testing: 04/07/23 DEXA scan  Pertinent review of systems: No fevers or chills  Relevant historical information: Osteoporosis   Exam:  BP 136/80   Pulse 65   Ht 5' 4.17" (1.63 m)   Wt 117 lb (53.1 kg)   SpO2 99%   BMI 19.98 kg/m  General: Well Developed, well nourished, and in no acute distress.   MSK: Right shoulder normal-appearing Normal motion.  Mild crepitation felt in superior shoulder with overhead motion. Not tender to palpation.  Intact strength negative impingement testing.    Assessment and Plan: 78 y.o. female with right shoulder crepitation with maybe a little bit of pain.  Pain due to rotator cuff impingement.  We discussed options.  Plan for home exercise program working on shoulder stabilization.  We talked about modification of lifting activity to avoid shoulder pain.  If needed next step would be physical therapy and would use potentially [facility] or horse [place]. Recheck back if not improving.   PDMP not reviewed this encounter. No orders of the defined types were placed in this encounter.  No orders of the defined types were placed in this encounter.    Discussed warning signs or symptoms. Please see discharge instructions. Patient expresses understanding.   The above documentation has been reviewed and is accurate and complete  Clementeen Graham, M.D.

## 2023-09-06 DIAGNOSIS — H2513 Age-related nuclear cataract, bilateral: Secondary | ICD-10-CM | POA: Diagnosis not present

## 2023-09-06 DIAGNOSIS — H02836 Dermatochalasis of left eye, unspecified eyelid: Secondary | ICD-10-CM | POA: Diagnosis not present

## 2023-09-06 DIAGNOSIS — H02833 Dermatochalasis of right eye, unspecified eyelid: Secondary | ICD-10-CM | POA: Diagnosis not present

## 2023-09-06 DIAGNOSIS — H353211 Exudative age-related macular degeneration, right eye, with active choroidal neovascularization: Secondary | ICD-10-CM | POA: Diagnosis not present

## 2023-09-06 DIAGNOSIS — H353132 Nonexudative age-related macular degeneration, bilateral, intermediate dry stage: Secondary | ICD-10-CM | POA: Diagnosis not present

## 2023-09-06 DIAGNOSIS — H353213 Exudative age-related macular degeneration, right eye, with inactive scar: Secondary | ICD-10-CM | POA: Diagnosis not present

## 2023-11-28 ENCOUNTER — Ambulatory Visit: Payer: Medicare Other | Admitting: Sports Medicine

## 2024-02-13 ENCOUNTER — Encounter: Payer: Self-pay | Admitting: Sports Medicine

## 2024-02-13 ENCOUNTER — Ambulatory Visit (INDEPENDENT_AMBULATORY_CARE_PROVIDER_SITE_OTHER): Admitting: Sports Medicine

## 2024-02-13 VITALS — BP 142/82 | HR 73 | Temp 98.1°F | Ht 64.0 in | Wt 115.4 lb

## 2024-02-13 DIAGNOSIS — C50912 Malignant neoplasm of unspecified site of left female breast: Secondary | ICD-10-CM | POA: Diagnosis not present

## 2024-02-13 DIAGNOSIS — T8543XD Leakage of breast prosthesis and implant, subsequent encounter: Secondary | ICD-10-CM | POA: Diagnosis not present

## 2024-02-13 MED ORDER — COVID-19 MRNA VACCINE (PFIZER) 30 MCG/0.3ML IM SUSP: 0.3000 mL | Freq: Once | INTRAMUSCULAR | 0 refills | Status: AC

## 2024-02-13 NOTE — Progress Notes (Signed)
 Careteam: Patient Care Team: Sherlynn Madden, MD as PCP - General (Internal Medicine) Lavoie, Marie-Lyne, MD as Referring Physician (Obstetrics and Gynecology) Leslee Reusing, MD as Consulting Physician (Ophthalmology)  PLACE OF SERVICE:  [facility]  Advanced Directive information    Allergies  Allergen Reactions   Duricef [Cefadroxil]    Latex     Chief Complaint  Patient presents with   Acute Visit    Left breast needs to be examined , patient stated she noticed it was leaking. Patient stated that she noticed it about 4 weeks ago changing in shape.      Discussed the use of AI scribe software for clinical note transcription with the patient, who gave verbal consent to proceed.  History of Present Illness  [name] is a 78 year old female with a history of breast cancer who presents with concerns about changes in her breast implant.  Her left breast implant, placed 36 years ago following a mastectomy for breast cancer, has recently changed in shape and feel. The implant is described as 'changing shape' and 'shifting,' and it feels softer than before. She is concerned about the possibility of the implant leaking.  No pain in the breast, lumps, bumps, or hard areas in the breast or armpit. No nipple discharge, fever, chills, fatigue beyond usual tiredness, cough, chest pain, abdominal pain, nausea, vomiting, urinary pain, or vaginal spotting.  Her last mammogram in January of this year.  She is not on any prescription medications, only taking vitamins. She engages in regular exercise, including gym workouts and strengthening exercises.     Review of Systems:  Review of Systems  Constitutional:  Negative for chills and fever.  HENT:  Negative for congestion and sore throat.   Respiratory:  Negative for cough, sputum production and shortness of breath.   Cardiovascular:  Negative for chest pain, palpitations and leg swelling.  Gastrointestinal:  Negative  for abdominal pain, heartburn and nausea.  Genitourinary:  Negative for dysuria, frequency and hematuria.  Musculoskeletal:  Negative for falls and myalgias.  Neurological:  Negative for dizziness.   Negative unless indicated in HPI.   Past Medical History:  Diagnosis Date   History of breast cancer    History of colonoscopy    History of mammogram    Macular degeneration    Past Surgical History:  Procedure Laterality Date   APPENDECTOMY     HIP SURGERY     Replacement   MASTECTOMY Left    OVARY SURGERY     Social History:   reports that she has never smoked. She has never used smokeless tobacco. She reports that she does not currently use alcohol. She reports that she does not use drugs.  Family History  Problem Relation Age of Onset   Breast cancer Mother    Heart attack Father     Medications: Patient's Medications  New Prescriptions   No medications on file  Previous Medications   B COMPLEX VITAMINS (B COMPLEX 50 PO)    Take 1 capsule by mouth every other day.   CALCIUM -MAGNESIUM-VITAMIN D (CALCIUM  MAGNESIUM PO)    Take 1 capsule by mouth daily. 1000mg , 500mg , (400I.U)   CYANOCOBALAMIN (VITAMIN B12 PO)    Take 2,500 mcg by mouth every other day.   OVER THE COUNTER MEDICATION    Take 1 capsule by mouth daily. HOST DEFENSE  MUSHROOMS MYCOBOTANICALS BRAIN   VITAMIN E 180 MG (400 UNITS) CAPSULE    Take 400 Units by mouth daily.  Modified Medications   No medications on file  Discontinued Medications   No medications on file    Physical Exam: Vitals:   02/13/24 1023  BP: (!) 142/78  Pulse: 73  Temp: 98.1 F (36.7 C)  SpO2: 98%  Weight: 115 lb 6.4 oz (52.3 kg)  Height: 5' 4 (1.626 m)   Body mass index is 19.81 kg/m. BP Readings from Last 3 Encounters:  02/13/24 (!) 142/78  07/27/23 136/80  05/30/23 118/76   Wt Readings from Last 3 Encounters:  02/13/24 115 lb 6.4 oz (52.3 kg)  07/27/23 117 lb (53.1 kg)  05/30/23 116 lb (52.6 kg)     Physical Exam Constitutional:      Appearance: Normal appearance.  HENT:     Head: Normocephalic and atraumatic.  Cardiovascular:     Rate and Rhythm: Normal rate and regular rhythm.  Pulmonary:     Effort: Pulmonary effort is normal. No respiratory distress.     Breath sounds: Normal breath sounds. No wheezing.  Abdominal:     General: Bowel sounds are normal. There is no distension.     Tenderness: There is no abdominal tenderness. There is no guarding or rebound.     Comments:    Musculoskeletal:        General: No swelling or tenderness.     Comments: Left breast -h/o breast implant No masses felt No lymphadenopathy   Neurological:     Mental Status: She is alert. Mental status is at baseline.     Motor: No weakness.     Labs reviewed: Basic Metabolic Panel: Recent Labs    05/26/23 0820  NA 140  K 4.3  CL 104  CO2 26  GLUCOSE 83  BUN 14  CREATININE 0.80  CALCIUM  10.2   Liver Function Tests: Recent Labs    05/26/23 0820  AST 13  ALT 13  BILITOT 0.6  PROT 6.9   No results for input(s): LIPASE, AMYLASE in the last 8760 hours. No results for input(s): AMMONIA in the last 8760 hours. CBC: Recent Labs    05/26/23 0820  WBC 6.3  NEUTROABS 3,742  HGB 13.1  HCT 40.3  MCV 90.0  PLT 335   Lipid Panel: Recent Labs    05/26/23 0820  CHOL 216*  HDL 77  LDLCALC 122*  TRIG 78  CHOLHDL 2.8   TSH: No results for input(s): TSH in the last 8760 hours. A1C: Lab Results  Component Value Date   HGBA1C 5.4 05/26/2023    Assessment and Plan Assessment & Plan   1. Leakage of breast implant, subsequent encounter (Primary) Pt has h/o breast cancer s/p mastectomy and noticed recent change in her implant Denies pain,  On exam- no lymphadenopathy  Left breast - soft   US  BREAST COMPLETE UNI LEFT INC AXILLA; Future  2. Malignant neoplasm of left female breast, unspecified estrogen receptor status, unspecified site of breast (HCC)   - US   BREAST COMPLETE UNI LEFT INC AXILLA; Future  Other orders - COVID-19 mRNA vaccine, Pfizer, 30 MCG/0.3ML injection; Inject 0.3 mLs into the muscle once for 1 dose.  Dispense: 0.3 mL; Refill: 0

## 2024-02-16 ENCOUNTER — Encounter: Payer: Self-pay | Admitting: Sports Medicine

## 2024-02-16 DIAGNOSIS — T8543XD Leakage of breast prosthesis and implant, subsequent encounter: Secondary | ICD-10-CM

## 2024-02-16 DIAGNOSIS — C50912 Malignant neoplasm of unspecified site of left female breast: Secondary | ICD-10-CM

## 2024-02-27 ENCOUNTER — Ambulatory Visit
Admission: RE | Admit: 2024-02-27 | Discharge: 2024-02-27 | Disposition: A | Discharge status: Home / Self Care | Source: Ambulatory Visit | Attending: Sports Medicine | Admitting: Sports Medicine

## 2024-02-27 ENCOUNTER — Ambulatory Visit: Payer: Self-pay | Admitting: Sports Medicine

## 2024-02-27 DIAGNOSIS — C50912 Malignant neoplasm of unspecified site of left female breast: Secondary | ICD-10-CM

## 2024-02-27 DIAGNOSIS — T8543XD Leakage of breast prosthesis and implant, subsequent encounter: Secondary | ICD-10-CM

## 2024-02-28 LAB — OPHTHALMOLOGY REPORT-SCANNED

## 2024-02-28 NOTE — Telephone Encounter (Signed)
 Please advise.   Copied from CRM [id number]. Topic: Referral - Status >> Feb 28, 2024  1:33 PM Mercer PEDLAR wrote: Reason for CRM: Patient is calling for status of surgeon referral which she was told was placed yesterday 02/27/24

## 2024-02-28 NOTE — Telephone Encounter (Signed)
Message routed to PCP Venita Sheffield, MD

## 2024-02-29 ENCOUNTER — Telehealth: Payer: Self-pay | Admitting: *Deleted

## 2024-02-29 NOTE — Telephone Encounter (Signed)
 The patient is calling back. Stated she has called [facility] and scheduled her own appointment, as this is very important to her. She is scheduled for Wednesday, October 15th, at 3pm. She stated she has reached out to them many times, and they still have not received a referral from our office and want to make sure they have this before her visit.

## 2024-02-29 NOTE — Telephone Encounter (Signed)
 Copied from CRM [id number]. Topic: Referral - Status >> Feb 28, 2024  1:33 PM Mercer PEDLAR wrote: Reason for CRM: Patient is calling for status of surgeon referral which she was told was placed yesterday 02/27/24 >> Feb 29, 2024  4:04 PM Miquel SAILOR wrote: Patient calling on update for referral to be sent to Surgeon. Stated she has called numerous times but I show this note. Called and transferred Alondra >> Feb 29, 2024  9:41 AM Susanna ORN wrote: Patient called in wanting to know where is she being referred to so that she can do some research. States she called yesterday and was told someone would call her back yesterday with that information and no one did. Advised patient that the request has been sent over to the referral coordinator and someone will contact her. Please give patient a call to provide this information. CB #: [phone].

## 2024-02-29 NOTE — Telephone Encounter (Signed)
 Copied from CRM [id number]. Topic: Referral - Status >> Feb 28, 2024  1:33 PM Mercer PEDLAR wrote: Reason for CRM: Patient is calling for status of surgeon referral which she was told was placed yesterday 02/27/24 >> Feb 29, 2024  9:41 AM Susanna ORN wrote: Patient called in wanting to know where is she being referred to so that she can do some research. States she called yesterday and was told someone would call her back yesterday with that information and no one did. Advised patient that the request has been sent over to the referral coordinator and someone will contact her. Please give patient a call to provide this information. CB #: [phone].

## 2024-02-29 NOTE — Addendum Note (Signed)
 Addended by: Georgie Eduardo on: 02/29/2024 11:31 AM   Modules accepted: Orders

## 2024-02-29 NOTE — Telephone Encounter (Signed)
 I do not see a referral placed for a Surgeon consult. Forwarded message to Dr. Sherlynn to place referral.

## 2024-02-29 NOTE — Telephone Encounter (Signed)
 Sherlynn Madden, MD to [facility]  (Selected Message)     02/29/24 10:25 AM See the note from yesterday    Order Specific Information  Order: Ambulatory Referral for Breast Surgery [Custom: [id number]]  Order #:         [id number] Qty: 1    Priority: Routine    Comment:Pt with h/o breast cancer s/p mastectomy with breast implantation 30            yrs ago , recent ultrasound showing ruptured breast implant , please            evaluate, thanks.    Associated Diagnoses      T85.43XD Rupture of implant of left breast, subsequent encounter   Forwarded to [facility], Referrals. I am unable to see this under referral tab.

## 2024-03-04 ENCOUNTER — Telehealth: Payer: Self-pay | Admitting: Sports Medicine

## 2024-03-04 DIAGNOSIS — T8543XD Leakage of breast prosthesis and implant, subsequent encounter: Secondary | ICD-10-CM

## 2024-03-05 NOTE — Telephone Encounter (Signed)
 Referral placed.

## 2024-03-06 ENCOUNTER — Ambulatory Visit (INDEPENDENT_AMBULATORY_CARE_PROVIDER_SITE_OTHER)

## 2024-03-06 VITALS — BP 153/77 | HR 89 | Ht 64.0 in | Wt 115.0 lb

## 2024-03-06 DIAGNOSIS — T8543XA Leakage of breast prosthesis and implant, initial encounter: Secondary | ICD-10-CM

## 2024-03-06 DIAGNOSIS — Z853 Personal history of malignant neoplasm of breast: Secondary | ICD-10-CM | POA: Diagnosis not present

## 2024-03-06 DIAGNOSIS — N6489 Other specified disorders of breast: Secondary | ICD-10-CM | POA: Diagnosis not present

## 2024-03-06 DIAGNOSIS — Z9012 Acquired absence of left breast and nipple: Secondary | ICD-10-CM

## 2024-03-06 NOTE — Progress Notes (Signed)
 CLINIC CONSULT    NAME: [name] MRN: [id number] DOB: August 20, 1945  Referring physician: [email or web address]@  CHIEF COMPLAINT:Possible Breast Implant Rupture  HPI:  [name] is a 78 y.o. year old female who presents with:  Rupture left silicone implant. History of left breast cancer s/p left mastectomy with implant based reconstruction and adjuvant chemotherapy in 1989. Never had the implant checked.   She has capsular contracture that includes distortion, no pain or hardness. She is concerned about rupture. She has not had an MRI but has had an US  that showed ruptured left implant. She would like the implants removed and the capsule removed. She does not want a mastopexy or the contralateral side addressed.  PMH: Past Medical History:  Diagnosis Date   History of breast cancer    History of colonoscopy    History of mammogram    Macular degeneration    PSH: Past Surgical History:  Procedure Laterality Date   APPENDECTOMY     HIP SURGERY     Replacement   MASTECTOMY Left    OVARY SURGERY      MEDICATIONS:   Current Outpatient Medications:    B Complex Vitamins (B COMPLEX 50 PO), Take 1 capsule by mouth every other day., Disp: , Rfl:    Calcium -Magnesium-Vitamin D (CALCIUM  MAGNESIUM PO), Take 1 capsule by mouth daily. 1000mg , 500mg , (400I.U), Disp: , Rfl:    Cyanocobalamin (VITAMIN B12 PO), Take 2,500 mcg by mouth every other day., Disp: , Rfl:    OVER THE COUNTER MEDICATION, Take 1 capsule by mouth daily. HOST DEFENSE  MUSHROOMS MYCOBOTANICALS BRAIN, Disp: , Rfl:    vitamin E 180 MG (400 UNITS) capsule, Take 400 Units by mouth daily., Disp: , Rfl:   ALLERGIES:  is allergic to duricef [cefadroxil] and latex.  FAMILY HISTORY: Family History  Problem Relation Age of Onset   Breast cancer Mother    Heart attack Father      EXAM:  Blood pressure (!) 153/77, pulse 89, height 5' 4 (1.626 m), weight 115 lb (52.2 kg), SpO2 97%. Constitutional: Good  color, good hydration. VSS.  Head and Neck: No lymphadenopathy, thyromegaly or masses Chest: Normal breathing, Normal shape and excursion.  Breasts: Grade 2 ptosis on the right, No masses, nipple discharge or axillary lymphadenopathy. Grade 2 Capsular Contracture on the left, abnormal shape of the breast with fullness on the lower pole.   CLINICAL DATA:  78 year old female here for evaluation of her left breast silicone implant which was placed approximate 36 years ago.   EXAM: ULTRASOUND OF THE LEFT BREAST   COMPARISON:  None available.   FINDINGS: Targeted ultrasound demonstrated free silicone anterior to the implant shell with a snowstorm appearance consistent with a ruptured implant. No suspicious findings.   IMPRESSION: Ruptured left silicone implant.   RECOMMENDATION: Surgical consultation with possible follow-up non-contrast enhanced breast MRI.   I have discussed the findings and recommendations with the patient. If applicable, a reminder letter will be sent to the patient regarding the next appointment.   BI-RADS CATEGORY  2: Benign.     Electronically Signed   By: Curtistine Noble   On: 02/27/2024 11:04    ASSESSMENT/PLAN The risks, benefits, goals and alternatives of total capsulectomy were discussed in detail.  We discussed the differences between the types of capsulectomy.  We will perform a total capsulectomy that may involve cauterization and curettage of areas that are very stuck to the chest wall.  Capsules will be sent  to pathology as removal of the implants.  We will obtain pictures of the implants and capsules.  If we see anything unusual we would send further testing but otherwise we would not do any other tests.  Risks of pneumothorax chest wall injury bleeding infection irregularity and cosmetic concerns requiring revisional surgery were discussed in detail.  Revisional surgery may require financial burden.  Symmetry is often difficult once implants are  removed.  Other risks of anesthesia including DVT PE and anesthetic complications were discussed also.  This operation if is somewhat from a breast augmentation in that it does require placement drains also and this was discussed.  Some patients require mastopexy.  The scars of the different types of mastopexy were discussed in detail. Patient does want a flat closure and does not want a mastopexy on the left side or the contralateral breast. We obtained pictures today.  Plan: Left breast total capsulectomy, implant removal and complex flat closure. PCP clearance requested.  Nell Gales M. Dali Kraner, MD [facility]

## 2024-03-07 ENCOUNTER — Telehealth: Payer: Self-pay

## 2024-03-07 NOTE — Telephone Encounter (Signed)
 Surgical clearance sent to PCP (Dr. Jackalyn Blazing) [phone]. Confirmation of receipt.

## 2024-03-13 NOTE — Telephone Encounter (Signed)
 Left the patient a detailed voicemail letting her know we received a request for surgical clearance from a [facility] specialist. If she could, please give us  a callback to schedule. Dr. Ignatius last day in office is 10/29.

## 2024-03-19 ENCOUNTER — Ambulatory Visit: Admitting: Sports Medicine

## 2024-03-19 ENCOUNTER — Telehealth: Payer: Self-pay

## 2024-03-19 ENCOUNTER — Encounter: Payer: Self-pay | Admitting: Sports Medicine

## 2024-03-19 VITALS — BP 130/82 | HR 64 | Temp 97.7°F | Ht 64.0 in | Wt 116.8 lb

## 2024-03-19 DIAGNOSIS — Z01818 Encounter for other preprocedural examination: Secondary | ICD-10-CM | POA: Diagnosis not present

## 2024-03-19 DIAGNOSIS — T8543XD Leakage of breast prosthesis and implant, subsequent encounter: Secondary | ICD-10-CM

## 2024-03-19 NOTE — Telephone Encounter (Signed)
 Office needs Surgery Clearance resent

## 2024-03-19 NOTE — Progress Notes (Signed)
 Careteam: Patient Care Team: Sherlynn Madden, MD as PCP - General (Internal Medicine) Lavoie, Marie-Lyne, MD as Referring Physician (Obstetrics and Gynecology) Leslee Reusing, MD as Consulting Physician (Ophthalmology)  PLACE OF SERVICE:  [facility]  Advanced Directive information    Allergies  Allergen Reactions   Duricef [Cefadroxil]    Latex     Chief Complaint  Patient presents with   Surgical clearance     Discussed the use of AI scribe software for clinical note transcription with the patient, who gave verbal consent to proceed.  History of Present Illness    [name] is a 78 year old female who presents for preoperative evaluation for a planned capsulectomy and implant removal.  She is scheduled for a capsulectomy and removal of her breast implant, pending insurance approval.    No pain or discomfort in the breast area, chest pain, shortness of breath, dizziness, or lightheadedness. She had a cold last week, which has since resolved.  She engages in regular physical activity, walking about 20 minutes daily [place] or using an elliptical machine. She used to do weight training but has stopped to avoid affecting her pectoral muscles.  She is not currently on many medications.  No chest pain, shortness of breath, dizziness, lightheadedness, palpitations, cough, or congestion.  Review of Systems:  Review of Systems  Constitutional:  Negative for chills and fever.  HENT:  Negative for congestion and sore throat.   Respiratory:  Negative for cough, sputum production and shortness of breath.   Cardiovascular:  Negative for chest pain, palpitations and leg swelling.  Gastrointestinal:  Negative for abdominal pain, heartburn and nausea.  Genitourinary:  Negative for dysuria, frequency and hematuria.  Musculoskeletal:  Negative for falls and myalgias.  Neurological:  Negative for dizziness.   Negative unless indicated in HPI.    Past Medical History:  Diagnosis Date   History of breast cancer    History of colonoscopy    History of mammogram    Macular degeneration    Past Surgical History:  Procedure Laterality Date   APPENDECTOMY     HIP SURGERY     Replacement   MASTECTOMY Left    OVARY SURGERY     Social History:   reports that she has never smoked. She has never used smokeless tobacco. She reports that she does not currently use alcohol. She reports that she does not use drugs.  Family History  Problem Relation Age of Onset   Breast cancer Mother    Heart attack Father     Medications: Patient's Medications  New Prescriptions   No medications on file  Previous Medications   B COMPLEX VITAMINS (B COMPLEX 50 PO)    Take 1 capsule by mouth every other day.   CALCIUM -MAGNESIUM-VITAMIN D (CALCIUM  MAGNESIUM PO)    Take 1 capsule by mouth daily. 1000mg , 500mg , (400I.U)   CYANOCOBALAMIN (VITAMIN B12 PO)    Take 2,500 mcg by mouth every other day.   OVER THE COUNTER MEDICATION    Take 1 capsule by mouth daily. HOST DEFENSE  MUSHROOMS MYCOBOTANICALS BRAIN   VITAMIN E 180 MG (400 UNITS) CAPSULE    Take 400 Units by mouth daily.  Modified Medications   No medications on file  Discontinued Medications   No medications on file    Physical Exam: There were no vitals filed for this visit. There is no height or weight on file to calculate BMI. BP Readings from Last 3 Encounters:  03/06/24 (!) 153/77  02/13/24 (!) 142/82  07/27/23 136/80   Wt Readings from Last 3 Encounters:  03/06/24 115 lb (52.2 kg)  02/13/24 115 lb 6.4 oz (52.3 kg)  07/27/23 117 lb (53.1 kg)    Physical Exam Constitutional:      Appearance: Normal appearance.  HENT:     Head: Normocephalic and atraumatic.  Cardiovascular:     Rate and Rhythm: Normal rate and regular rhythm.  Pulmonary:     Effort: Pulmonary effort is normal. No respiratory distress.     Breath sounds: Normal breath sounds. No wheezing.   Abdominal:     General: Bowel sounds are normal. There is no distension.     Tenderness: There is no abdominal tenderness. There is no guarding or rebound.     Comments:    Musculoskeletal:        General: No swelling.  Neurological:     Mental Status: She is alert. Mental status is at baseline.     Sensory: No sensory deficit.     Motor: No weakness.     Labs reviewed: Basic Metabolic Panel: Recent Labs    05/26/23 0820  NA 140  K 4.3  CL 104  CO2 26  GLUCOSE 83  BUN 14  CREATININE 0.80  CALCIUM  10.2   Liver Function Tests: Recent Labs    05/26/23 0820  AST 13  ALT 13  BILITOT 0.6  PROT 6.9   No results for input(s): LIPASE, AMYLASE in the last 8760 hours. No results for input(s): AMMONIA in the last 8760 hours. CBC: Recent Labs    05/26/23 0820  WBC 6.3  NEUTROABS 3,742  HGB 13.1  HCT 40.3  MCV 90.0  PLT 335   Lipid Panel: Recent Labs    05/26/23 0820  CHOL 216*  HDL 77  LDLCALC 122*  TRIG 78  CHOLHDL 2.8   TSH: No results for input(s): TSH in the last 8760 hours. A1C: Lab Results  Component Value Date   HGBA1C 5.4 05/26/2023    Assessment and Plan Assessment & Plan   1. Pre-operative clearance (Primary)  EKG with no acute st t wave changes Pt is low risk going for low risk procedure - EKG 12-Lead - CBC (no diff) - Basic Metabolic Panel  2. Rupture of implant of left breast, subsequent encounter   - CBC (no diff) - Basic Metabolic Panel  Other orders - Multiple Vitamin (MULTIVITAMIN ADULT PO); Take by mouth. Not taking everyday - co-enzyme Q-10 30 MG capsule; Take 30 mg by mouth 3 (three) times daily. - UNABLE TO FIND; Med Name: Lundy Turkey Tail 2xq 1000mg  - Probiotic Product (PROBIOTIC PO); Take by mouth in the morning and at bedtime.

## 2024-03-19 NOTE — Telephone Encounter (Signed)
 Contacted the [facility] plastic surgery clinic regarding sending over patients surgery clearance papers due to the papers being misplaced. Spoke with Hadassah who works with the Plastic surgery and she stated that she will send the information over to the clinical staff to get the papers faxed to us .

## 2024-03-20 ENCOUNTER — Ambulatory Visit: Payer: Self-pay | Admitting: Sports Medicine

## 2024-03-20 ENCOUNTER — Telehealth: Payer: Self-pay

## 2024-03-20 LAB — HEPATITIS C ANTIBODY: Hepatitis C Ab: NONREACTIVE

## 2024-03-20 LAB — BASIC METABOLIC PANEL WITH GFR
BUN: 15 mg/dL (ref 7–25)
CO2: 25 mmol/L (ref 20–32)
Calcium: 10 mg/dL (ref 8.6–10.4)
Chloride: 103 mmol/L (ref 98–110)
Creat: 0.85 mg/dL (ref 0.60–1.00)
Glucose, Bld: 88 mg/dL (ref 65–99)
Potassium: 4.1 mmol/L (ref 3.5–5.3)
Sodium: 139 mmol/L (ref 135–146)
eGFR: 71 mL/min/1.73m2 (ref >=60)

## 2024-03-20 LAB — CBC
HCT: 39.9 % (ref 35.0–45.0)
Hemoglobin: 13.2 g/dL (ref 11.7–15.5)
MCH: 29.7 pg (ref 27.0–33.0)
MCHC: 33.1 g/dL (ref 32.0–36.0)
MCV: 89.7 fL (ref 80.0–100.0)
MPV: 10.4 fL (ref 7.5–12.5)
Platelets: 337 Thousand/uL (ref 140–400)
RBC: 4.45 Million/uL (ref 3.80–5.10)
RDW: 12.2 % (ref 11.0–15.0)
WBC: 6.8 Thousand/uL (ref 3.8–10.8)

## 2024-03-20 NOTE — Telephone Encounter (Signed)
 Copied from CRM [id number]. Topic: General - Other >> Mar 20, 2024  1:12 PM DeAngela L wrote: Reason for CRM: Alice with [facility] has questions for Ashonte in the office about surgical clearance  Just wanted to clarify something  Isaiah [phone]

## 2024-03-20 NOTE — Telephone Encounter (Signed)
 Spoke with [name] and surgical clearance form will be faxed to our office

## 2024-03-20 NOTE — Telephone Encounter (Signed)
 Tried contacting [facility] as well left a message to return call.

## 2024-03-20 NOTE — Telephone Encounter (Signed)
 Faxed patients surgery clearance back over with Sherlynn Madden, MD signature. Also made a copy to be scanned in patients chart.

## 2024-03-20 NOTE — Telephone Encounter (Signed)
 Call returned to [facility], no answer, and I left a voicemail requesting a return call. I also stated that I would send a message in epic as an attempt to address clarification needed

## 2024-04-12 ENCOUNTER — Encounter: Admitting: Student

## 2024-05-17 ENCOUNTER — Encounter

## 2024-05-30 ENCOUNTER — Encounter: Admitting: Student

## 2024-05-30 ENCOUNTER — Ambulatory Visit (INDEPENDENT_AMBULATORY_CARE_PROVIDER_SITE_OTHER): Admitting: Student

## 2024-05-30 ENCOUNTER — Ambulatory Visit: Payer: Self-pay | Admitting: Adult Health

## 2024-05-30 VITALS — BP 139/76 | HR 74 | Wt 115.6 lb

## 2024-05-30 DIAGNOSIS — Z853 Personal history of malignant neoplasm of breast: Secondary | ICD-10-CM | POA: Diagnosis not present

## 2024-05-30 DIAGNOSIS — T8543XA Leakage of breast prosthesis and implant, initial encounter: Secondary | ICD-10-CM | POA: Diagnosis not present

## 2024-05-30 MED ORDER — ONDANSETRON HCL 4 MG PO TABS: 4.0000 mg | ORAL_TABLET | Freq: Three times a day (TID) | ORAL | 0 refills | Status: AC | PRN

## 2024-05-30 MED ORDER — TRAMADOL HCL 50 MG PO TABS: 50.0000 mg | ORAL_TABLET | Freq: Three times a day (TID) | ORAL | 0 refills | Status: AC | PRN

## 2024-05-30 NOTE — Progress Notes (Signed)
 "    Patient ID: [name], female    DOB: 04/27/1946, 79 y.o.   MRN: [id number]  Chief Complaint  Patient presents with   Pre-op Exam      ICD-10-CM   1. History of breast cancer  Z85.3     2. Rupture of implant of left breast, initial encounter  T85.43XA        History of Present Illness: [name] is a 79 y.o.  female  with a history of breast cancer status post breast reconstruction..  She presents for preoperative evaluation for upcoming procedure, left breast total capsulectomy, implant removal and flap closure, scheduled for 06/11/2024 with Dr. Montorfano.  The patient has had problems with anesthesia.  She reports that she has had nausea and vomiting after anesthesia.  Denies any other issues with anesthesia.  Patient denies any history of cardiac disease.  She denies taking any blood thinners.  Patient reports she is not a smoker.  Patient denies taking any hormone replacement.  She denies any history of greater than 3 miscarriages.  She denies any personal family history of blood clots or clotting diseases.  She denies any recent surgeries, traumas or infections.  She denies any history of stroke or heart attack.  She denies any history of Crohn's disease or ulcerative colitis, COPD or asthma.  She denies any varicosities to her lower extremities.  She denies any recent fevers, chills or changes in her health.  Patient does report that she has a latex allergy.  She reports that when tapes containing light socks are placed on her skin, she develops a rash.  Summary of Previous Visit: Patient was seen for initial consult by Dr. Montorfano on 03/06/2024.  At this visit, it was noted that patient had history of left breast cancer status post left mastectomy with implant-based reconstruction and adjuvant therapy in 1989.  Patient had recently had an ultrasound that showed ruptured left implant.  Patient was requesting removal of her implant and the capsule to be removed.   Patient also reported that she wanted a flat closure and did not want a mastopexy on the left side or the contralateral breast.  Plan was to move forward with left breast total capsulectomy, implant removal and complex flap closure.  Surgical clearance was received from patient's primary care provider, Dr.Prashanthi.   Job: Does not work at this time  PMH Significant for: Osteoporosis, rupture of breast implant   Past Medical History: Allergies: Allergies[1]  Current Medications: Current Medications[2]  Past Medical Problems: Past Medical History:  Diagnosis Date   History of breast cancer    History of colonoscopy    History of mammogram    Macular degeneration     Past Surgical History: Past Surgical History:  Procedure Laterality Date   APPENDECTOMY     HIP SURGERY     Replacement   MASTECTOMY Left    OVARY SURGERY      Social History: Social History   Socioeconomic History   Marital status: Married    Spouse name: Not on file   Number of children: Not on file   Years of education: Not on file   Highest education level: Not on file  Occupational History   Not on file  Tobacco Use   Smoking status: Never   Smokeless tobacco: Never  Substance and Sexual Activity   Alcohol use: Not Currently   Drug use: Never   Sexual activity: Not on file  Other Topics Concern  Not on file  Social History Narrative   Tobacco use, amount per day now: None   Past tobacco use, amount per day: None   How many years did you use tobacco:    Alcohol use (drinks per week): 1   Diet:   Do you drink/eat things with caffeine: Yes   Marital status:  Married                                What year were you married? 1995   Do you live in a house, apartment, assisted living, condo, trailer, etc.? House   Is it one or more stories? Yes   How many persons live in your home? 1    Do you have pets in your home?( please list) Cat   Highest Level of education completed? Ph.D.     Current or past profession: Health And Safety Inspector   Do you exercise?  Yes                                Type and how often? Walk/Weights 3-4 times weekly.    Do you have a living will? Yes   Do you have a DNR form?     No                              If not, do you want to discuss one?   Do you have signed POA/HPOA forms? Yes                   If so, please bring to you appointment      Do you have any difficulty bathing or dressing yourself?  No   Do you have any difficulty preparing food or eating? No   Do you have any difficulty managing your medications? No   Do you have any difficulty managing your finances? No   Do you have any difficulty affording your medications?  No   Social Drivers of Health   Tobacco Use: Low Risk (03/19/2024)   Patient History    Smoking Tobacco Use: Never    Smokeless Tobacco Use: Never    Passive Exposure: Not on file  Financial Resource Strain: Not on file  Food Insecurity: Not on file  Transportation Needs: Not on file  Physical Activity: Not on file  Stress: Not on file  Social Connections: Not on file  Intimate Partner Violence: Not on file  Depression (PHQ2-9): Low Risk (02/13/2024)   Depression (PHQ2-9)    PHQ-2 Score: 0  Alcohol Screen: Not on file  Housing: Not on file  Utilities: Not on file  Health Literacy: Not on file    Family History: Family History  Problem Relation Age of Onset   Breast cancer Mother    Heart attack Father     Review of Systems: Patient denies any recent fevers, chills or changes in her health  Physical Exam: Vital Signs BP 139/76 (BP Location: Right Arm, Patient Position: Sitting, Cuff Size: Normal)   Pulse 74   Wt 115 lb 9.6 oz (52.4 kg)   SpO2 100%   BMI 19.84 kg/m   Physical Exam  Constitutional:      General: Not in acute distress.    Appearance: Normal appearance. Not ill-appearing.  HENT:     Head: Normocephalic and  atraumatic.  Eyes:     Pupils: Pupils are equal,  round Neck:     Musculoskeletal: Normal range of motion.  Cardiovascular:     Rate and Rhythm: Normal rate Pulmonary:     Effort: Pulmonary effort is normal. No respiratory distress.  Musculoskeletal: Normal range of motion.  Skin:    General: Skin is warm and dry.     Findings: No erythema or rash.  Neurological:     Mental Status: Alert and oriented to person, place, and time. Mental status is at baseline.  Psychiatric:        Mood and Affect: Mood normal.        Behavior: Behavior normal.    Assessment/Plan: The patient is scheduled for left breast total capsulectomy, implant removal and flap closure with Dr. Montorfano.  Risks, benefits, and alternatives of procedure discussed, questions answered and consent obtained.    Smoking Status: Non-smoker; Counseling Given?  N/A Last Mammogram: Patient does not receive mammograms on her left side as she is status post mastectomy.  She recently had a mammogram on her right side on 05/25/2023 which was BI-RADS Category 1 negative  Caprini Score: 7; Risk Factors include: Age, history of malignancy and length of planned surgery. Recommendation for mechanical and possible pharmacological prophylaxis. Encourage early ambulation.  Will discuss the possibility of postoperative Lovenox with Dr. Montorfano.  Pictures obtained: @consult   Post-op Rx sent to pharmacy: Tramadol , Zofran   Instructed the patient to hold any multivitamins or supplements at least 1 week prior to surgery.  Patient expressed understanding.  Patient was provided with the General Surgical Risk consent document and Pain Medication Agreement prior to their appointment.  They had adequate time to read through the risk consent documents and Pain Medication Agreement. We also discussed them in person together during this preop appointment. All of their questions were answered to their satisfaction.  Recommended calling if they have any further questions.  Risk consent form and Pain  Medication Agreement to be scanned into patient's chart.  The consent was obtained with risks and complications reviewed which included bleeding, pain, scar, infection and the risk of anesthesia.  The patients questions were answered to the patients expressed satisfaction.    Electronically signed by: Estefana FORBES Peck, PA-C 05/30/2024 5:34 PM      [1]  Allergies Allergen Reactions   Duricef [Cefadroxil]    Latex   [2]  Current Outpatient Medications:    B Complex Vitamins (B COMPLEX 50 PO), Take 1 capsule by mouth every other day., Disp: , Rfl:    Calcium -Magnesium-Vitamin D (CALCIUM  MAGNESIUM PO), Take 1 capsule by mouth daily. 1000mg , 500mg , (400I.U), Disp: , Rfl:    co-enzyme Q-10 30 MG capsule, Take 30 mg by mouth 3 (three) times daily., Disp: , Rfl:    Cyanocobalamin (VITAMIN B12 PO), Take 2,500 mcg by mouth every other day., Disp: , Rfl:    Multiple Vitamin (MULTIVITAMIN ADULT PO), Take by mouth. Not taking everyday, Disp: , Rfl:    ondansetron  (ZOFRAN ) 4 MG tablet, Take 1 tablet (4 mg total) by mouth every 8 (eight) hours as needed for up to 15 doses for nausea or vomiting., Disp: 15 tablet, Rfl: 0   OVER THE COUNTER MEDICATION, Take 1 capsule by mouth daily. HOST DEFENSE  MUSHROOMS MYCOBOTANICALS BRAIN, Disp: , Rfl:    Probiotic Product (PROBIOTIC PO), Take by mouth in the morning and at bedtime., Disp: , Rfl:    traMADol  (ULTRAM ) 50 MG tablet, Take 1 tablet (  50 mg total) by mouth every 8 (eight) hours as needed for up to 10 doses for moderate pain (pain score 4-6) or severe pain (pain score 7-10)., Disp: 10 tablet, Rfl: 0   UNABLE TO FIND, Med Name: Oranic [place] Tail 2xq 1000mg  (Patient not taking: Reported on 05/30/2024), Disp: , Rfl:    vitamin E 180 MG (400 UNITS) capsule, Take 400 Units by mouth daily. (Patient not taking: Reported on 05/30/2024), Disp: , Rfl:   "

## 2024-05-31 ENCOUNTER — Telehealth: Payer: Self-pay

## 2024-05-31 NOTE — Telephone Encounter (Signed)
 [facility] - [name]    Pt stated that the same forms that we are asking to be filed out again have already been completed back in December, does she need to get these completed again ?   Please advise

## 2024-05-31 NOTE — Telephone Encounter (Signed)
 Patient should not need new PCP clearance since we already received one and patient did not report any changes in her health at her preoperative appointment yesterday

## 2024-05-31 NOTE — Telephone Encounter (Signed)
 Pt called and stated that she does not have an assigned PCP, her PCP left the office and has not been assigned a new one.  She advised that the spoke with her PCP office and they stated that a NP could sign the required papers, but the pt wanted to confirm that would be okay before having them do so. Please advise.

## 2024-06-03 ENCOUNTER — Ambulatory Visit: Admitting: Adult Health

## 2024-06-03 ENCOUNTER — Telehealth: Payer: Self-pay

## 2024-06-03 NOTE — Telephone Encounter (Signed)
 Patient came into office today for a pre-op visit but she obtained a clearance on 03/19/24. Per notes on 05/31/24 from Plastic surgery patient does not need another pre-op clearance. Appointment was cancelled

## 2024-06-06 ENCOUNTER — Telehealth: Payer: Self-pay | Admitting: Student

## 2024-06-06 LAB — OPHTHALMOLOGY REPORT-SCANNED

## 2024-06-06 NOTE — Telephone Encounter (Signed)
 Encounter made in error.

## 2024-06-11 DIAGNOSIS — Z853 Personal history of malignant neoplasm of breast: Secondary | ICD-10-CM | POA: Diagnosis not present

## 2024-06-11 DIAGNOSIS — T8543XA Leakage of breast prosthesis and implant, initial encounter: Secondary | ICD-10-CM | POA: Diagnosis not present

## 2024-06-12 ENCOUNTER — Telehealth: Payer: Self-pay

## 2024-06-12 NOTE — Telephone Encounter (Signed)
 Tube came off and hinks she has air in it, pt stated she does not think it is draining correctly, pt wants to know what to do, she stated that she called the clinical line today, but I let her know we have had back to back pts and clinic may not get to vmails until this afternoon. Can clinical please advise the patient.

## 2024-06-12 NOTE — Telephone Encounter (Signed)
 I called the patient and she stated that the bulb is holding suction and the drain is functional. She was just concerned since it looked like there was air in the tube. I let her know that as long as the fluid was flowing into the bulb, the tube was fine. Patient conveyed understanding.

## 2024-06-19 ENCOUNTER — Ambulatory Visit

## 2024-06-19 VITALS — BP 132/70 | HR 76 | Wt 117.0 lb

## 2024-06-19 DIAGNOSIS — Z09 Encounter for follow-up examination after completed treatment for conditions other than malignant neoplasm: Secondary | ICD-10-CM

## 2024-06-19 DIAGNOSIS — Z9889 Other specified postprocedural states: Secondary | ICD-10-CM

## 2024-06-19 DIAGNOSIS — Z9886 Personal history of breast implant removal: Secondary | ICD-10-CM

## 2024-06-19 NOTE — Progress Notes (Signed)
" ° °  Established Patient Office Visit  Subjective   Patient ID: [name], female    DOB: 1946-05-12  Age: 79 y.o. MRN: [id number]  Chief Complaint  Patient presents with   Post-op Follow-up    HPI [name] is a 79 y.o.  female  with a history of breast cancer status post breast reconstruction.  She underwent a left breast total capsulectomy, implant removal and flap closure on 06/11/2024.  Patient has been doing well after surgery.  Reports no pain.  JP is serosanguineous with greater than 30 cc output today.    Objective:     BP 132/70 (BP Location: Left Arm, Patient Position: Sitting, Cuff Size: Normal)   Pulse 76   Wt 117 lb (53.1 kg)   SpO2 98%   BMI 20.08 kg/m  BP Readings from Last 3 Encounters:  06/19/24 132/70  05/30/24 139/76  03/19/24 130/82    Physical Exam MA as chaperone Incision clean dry intact with Steri-Strips in place.  No signs of seroma or hematoma.  No signs of infection.  JP is serosanguineous.  The 10-year ASCVD risk score (Arnett DK, et al., 2019) is: 19.2%    Assessment & Plan:   Problem List Items Addressed This Visit   None Visit Diagnoses       Status post breast implant removal    -  Primary     Follow-up examination          Patient is doing well after surgery.  Pain has been under control.  Drain is serosanguineous in nature but output is still elevated more than 30 cc/day.  I explained to the patient that if we remove the drain too early she is going to develop a seroma.  We will keep the drain for now and she will follow-up in a week with me or PA to reassess and see if the drain can be removed.  Otherwise patient doing well instructed to keep Steri-Strips in place, no heavy lifting or extraneous activities for 6 weeks.  Instructed to call if any issues or concerns earlier.  Patient expressed understanding and agrees with the plan.  Duriel Deery M Brookelynne Dimperio, MD  "

## 2024-06-21 ENCOUNTER — Telehealth: Payer: Self-pay

## 2024-06-21 NOTE — Telephone Encounter (Signed)
 Returned patients call. Advised her drain has been under 30 CCs for the past 3 days.  Today, it is measuring approximately 5 Ccs. Her next appointment is on 06/27/2024 with Estefana E. to have the drains removed earlier. Explained we are fully booked next week.Patient requested to be put on the waiting list incase someone cancels their appointment. Advised her I will let the front desk know. Patient understood and agreed with plan.

## 2024-06-27 ENCOUNTER — Ambulatory Visit: Admitting: Student

## 2024-06-27 VITALS — BP 138/81 | HR 69

## 2024-06-27 DIAGNOSIS — Z9886 Personal history of breast implant removal: Secondary | ICD-10-CM

## 2024-06-27 NOTE — Progress Notes (Signed)
 Patient is a 79 year old female who recently underwent left breast total capsulectomy, implant removal and flap closure with Dr. Montorfano on 06/11/2024.  Patient is a little over 2 weeks postop.  She presents to the clinic today for postoperative follow-up.  Patient was last seen in the clinic on 06/19/2024.  At this visit, patient reported she was doing well.  JP drain was serosanguineous with over 30 cc of output.  On exam, incision was clean dry and intact with Steri-Strips.  There is no sign of infection.  Plan is for patient to follow-up in 1 week.  Today, patient reports she is doing well.  She states that the drain has been irritating her.  She reports that her drain output has been less than 30 cc for the past several days.  She denies any other issues or concerns.  She denies any fevers or chills.  Chaperone present on exam.  On exam, patient sitting upright in no acute distress.  Left chest is soft.  There is no overlying erythema, no obvious fluid collection on exam.  Steri-Strips are in place over the incision and are clean dry and intact.  Left JP drain is in place with minimal serous drainage in the bulb.  There are no signs of infection on exam.  JP drain was removed without difficulty.  Patient tolerated well.  Discussed with the patient that her drain site may drain over the next few days.  Discussed with her that she may apply gauze and tape over the drain site daily.  Discussed with her she may also apply Vaseline over the drain state starting in the next day or 2.  Patient expressed understanding.  Discussed with the patient to continue with compression at all times and avoid strenuous activities.  Patient to follow-up in about 2 weeks.  Instructed her to call with any questions or concerns.  Pictures were obtained of the patient and placed in the chart with the patient's or guardian's permission.

## 2024-07-05 ENCOUNTER — Encounter: Admitting: Student

## 2024-07-19 ENCOUNTER — Encounter

## 2024-07-31 ENCOUNTER — Encounter

## 2024-09-04 ENCOUNTER — Encounter
